# Patient Record
Sex: Male | Born: 1980 | Hispanic: No | Marital: Single | Smoking: Never smoker
Health system: Southern US, Community
[De-identification: ages and names within clinical notes are randomized; demographics above are authoritative.]

## PROBLEM LIST (undated history)

## (undated) DIAGNOSIS — F909 Attention-deficit hyperactivity disorder, unspecified type: Secondary | ICD-10-CM

## (undated) DIAGNOSIS — F319 Bipolar disorder, unspecified: Secondary | ICD-10-CM

## (undated) DIAGNOSIS — F32A Depression, unspecified: Secondary | ICD-10-CM

## (undated) DIAGNOSIS — F329 Major depressive disorder, single episode, unspecified: Secondary | ICD-10-CM

## (undated) HISTORY — DX: Depression, unspecified: F32.A

## (undated) HISTORY — DX: Major depressive disorder, single episode, unspecified: F32.9

---

## 2014-12-17 ENCOUNTER — Emergency Department (HOSPITAL_COMMUNITY): Payer: 59

## 2014-12-17 ENCOUNTER — Other Ambulatory Visit: Payer: Self-pay

## 2014-12-17 ENCOUNTER — Emergency Department (HOSPITAL_COMMUNITY)
Admission: EM | Admit: 2014-12-17 | Discharge: 2014-12-17 | Disposition: A | Discharge status: Home / Self Care | Payer: 59 | Attending: Emergency Medicine | Admitting: Emergency Medicine

## 2014-12-17 ENCOUNTER — Encounter (HOSPITAL_COMMUNITY): Payer: Self-pay | Admitting: Family Medicine

## 2014-12-17 DIAGNOSIS — R0602 Shortness of breath: Secondary | ICD-10-CM | POA: Diagnosis not present

## 2014-12-17 DIAGNOSIS — H538 Other visual disturbances: Secondary | ICD-10-CM | POA: Insufficient documentation

## 2014-12-17 DIAGNOSIS — F319 Bipolar disorder, unspecified: Secondary | ICD-10-CM | POA: Insufficient documentation

## 2014-12-17 DIAGNOSIS — F419 Anxiety disorder, unspecified: Secondary | ICD-10-CM | POA: Insufficient documentation

## 2014-12-17 DIAGNOSIS — R51 Headache: Secondary | ICD-10-CM | POA: Insufficient documentation

## 2014-12-17 DIAGNOSIS — H539 Unspecified visual disturbance: Secondary | ICD-10-CM

## 2014-12-17 DIAGNOSIS — R000 Tachycardia, unspecified: Secondary | ICD-10-CM | POA: Diagnosis not present

## 2014-12-17 HISTORY — DX: Bipolar disorder, unspecified: F31.9

## 2014-12-17 HISTORY — DX: Attention-deficit hyperactivity disorder, unspecified type: F90.9

## 2014-12-17 LAB — BASIC METABOLIC PANEL
ANION GAP: 7 (ref 5–15)
BUN: 9 mg/dL (ref 6–20)
CALCIUM: 9.4 mg/dL (ref 8.9–10.3)
CO2: 26 mmol/L (ref 22–32)
Chloride: 106 mmol/L (ref 101–111)
Creatinine, Ser: 1.19 mg/dL (ref 0.61–1.24)
GFR calc Af Amer: >60 mL/min (ref >60)
Glucose, Bld: 94 mg/dL (ref 65–99)
POTASSIUM: 4.1 mmol/L (ref 3.5–5.1)
Sodium: 139 mmol/L (ref 135–145)

## 2014-12-17 LAB — CBC
HCT: 42.6 % (ref 39.0–52.0)
Hemoglobin: 14.2 g/dL (ref 13.0–17.0)
MCH: 29.8 pg (ref 26.0–34.0)
MCHC: 33.3 g/dL (ref 30.0–36.0)
MCV: 89.3 fL (ref 78.0–100.0)
PLATELETS: 156 10*3/uL (ref 150–400)
RBC: 4.77 MIL/uL (ref 4.22–5.81)
RDW: 12.8 % (ref 11.5–15.5)
WBC: 8 10*3/uL (ref 4.0–10.5)

## 2014-12-17 LAB — LITHIUM LEVEL: Lithium Lvl: 0.41 mmol/L — ABNORMAL LOW (ref 0.60–1.20)

## 2014-12-17 LAB — I-STAT TROPONIN, ED: TROPONIN I, POC: 0 ng/mL (ref 0.00–0.08)

## 2014-12-17 MED ORDER — GADOBENATE DIMEGLUMINE 529 MG/ML IV SOLN
19.0000 mL | Freq: Once | INTRAVENOUS | Status: AC | PRN
  Administered 2014-12-17: 19 mL via INTRAVENOUS

## 2014-12-17 MED ORDER — LITHIUM CARBONATE 300 MG PO CAPS
450.0000 mg | ORAL_CAPSULE | Freq: Two times a day (BID) | ORAL | Status: DC
  Administered 2014-12-17: 450 mg via ORAL
  Filled 2014-12-17 (×2): qty 1

## 2014-12-17 NOTE — ED Notes (Signed)
Pt here for blurred vision, confusion, SOB, tachy and headaches over the past week. Sent here by doctor.

## 2014-12-17 NOTE — ED Provider Notes (Signed)
Pt's care turned over to me with ct pending and need for discussion with Neurology.  Pt has had visual loss on and off for the past 2 weeks.  On my evaluation pt still has visual difficulty.  Pt reports decreased vision with both eyes.  Ct returned and is normal  I discussed with Dr. Cyril Mourningamillo who advised MRi with and without.  Pt request dosage of lithium.   Pt has stable vitals.   MRi with and without is negative.   I discussed with Dr. Roseanne RenoStewart who advised to have pt follow up with Opthomology for further vision evaluation.   Lonia SkinnerLeslie K WaldorfSofia, PA-C 12/17/14 2146  Pricilla LovelessScott Goldston, MD 12/19/14 [phone]

## 2014-12-17 NOTE — Discharge Instructions (Signed)
Visual Disturbances °You have had a disturbance in your vision. This may be caused by various conditions, such as: °· Migraines. Migraine headaches are often preceded by a disturbance in vision. Blind spots or light flashes are followed by a headache. This type of visual disturbance is temporary. It does not damage the eye. °· Glaucoma. This is caused by increased pressure in the eye. Symptoms include haziness, blurred vision, or seeing rainbow colored circles when looking at bright lights. Partial or complete visual loss can occur. You may or may not experience eye pain. Visual loss may be gradual or sudden and is irreversible. Glaucoma is the leading cause of blindness. °· Retina problems. Vision will be reduced if the retina becomes detached or if there is a circulation problem as with diabetes, high blood pressure, or a mini-stroke. Symptoms include seeing "floaters," flashes of light, or shadows, as if a curtain has fallen over your eye. °· Optic nerve problems. The main nerve in your eye can be damaged by redness, soreness, and swelling (inflammation), poor circulation, drugs, and toxins. °It is very important to have a complete exam done by a specialist to determine the exact cause of your eye problem. The specialist may recommend medicines or surgery, depending on the cause of the problem. This can help prevent further loss of vision or reduce the risk of having a stroke. Contact the caregiver to whom you have been referred and arrange for follow-up care right away. °SEEK IMMEDIATE MEDICAL CARE IF:  °· Your vision gets worse. °· You develop severe headaches. °· You have any weakness or numbness in the face, arms, or legs. °· You have any trouble speaking or walking. °  °This information is not intended to replace advice given to you by your health care provider. Make sure you discuss any questions you have with your health care provider. °  °Document Released: 03/05/2004 Document Revised: 04/20/2011 Document  Reviewed: 07/05/2013 °Elsevier Interactive Patient Education ©2016 [employer] ° °

## 2014-12-17 NOTE — ED Provider Notes (Signed)
CSN: [id number]     Arrival date & time 12/17/14  1131 History   First MD Initiated Contact with Patient 12/17/14 1356     Chief Complaint  Patient presents with  . Blurred Vision  . Headache  . Shortness of Breath  . Tachycardia     (Consider location/radiation/quality/duration/timing/severity/associated sxs/prior Treatment) HPI   Michael Higgins is a(n) 34 y.o. male who presents to the ED with cc of intermittent blurry vision, hot flashes, intermittent mild headaches, and short term memory deficits. Onset 1 week ago. Patient states that at times he has had difficulty driving, seeing road signs, or his computer screen. It is affecting his work as her travels to different work sites. The patient is on lithium was afraid he may have a lithium toxicity. He denies gait abnormality, myoclonic jerking. His partner who is with him states that he does seem to have some short-term memory deficits as of recent. The patient has mild global headaches that do not seem to correlate with his visual disturbances. He is not taking any anti-cholinergics. He has no difficulty with urination. No dry mouth, or sweating. Patient states that he has been on his current medication regimen for some time and has had no recent changes.  He has a past medical history of HIV and is currently taking medication.  Past Medical History  Diagnosis Date  . Bipolar 1 disorder (HCC)   . ADHD (attention deficit hyperactivity disorder)    History reviewed. No pertinent past surgical history. History reviewed. No pertinent family history. Social History  Substance Use Topics  . Smoking status: Never Smoker   . Smokeless tobacco: None  . Alcohol Use: Yes    Review of Systems  Ten systems reviewed and are negative for acute change, except as noted in the HPI.   Allergies  Wellbutrin  Home Medications   Prior to Admission medications   Medication Sig Start Date End Date Taking? Authorizing Provider   Abacavir-Dolutegravir-Lamivud (TRIUMEQ) 600-50-300 MG TABS Take 1 tablet by mouth daily.   Yes Historical Provider, MD  amphetamine-dextroamphetamine (ADDERALL XR) 30 MG 24 hr capsule Take 30 mg by mouth 2 (two) times daily.   Yes Historical Provider, MD  ibuprofen (ADVIL,MOTRIN) 200 MG tablet Take 200 mg by mouth every 6 (six) hours as needed for fever.   Yes Historical Provider, MD  lithium carbonate 150 MG capsule Take 450 mg by mouth 2 (two) times daily with a meal.   Yes Historical Provider, MD  LORazepam (ATIVAN) 1 MG tablet Take 1 mg by mouth 2 (two) times daily as needed for anxiety.   Yes Historical Provider, MD  omeprazole (PRILOSEC) 20 MG capsule Take 20 mg by mouth daily as needed (heartburn).   Yes Historical Provider, MD   BP 138/86 mmHg  Pulse 89  Resp 16  SpO2 98% Physical Exam  Constitutional: He is oriented to person, place, and time. He appears well-developed and well-nourished.  HENT:  Head: Normocephalic and atraumatic.  Eyes: Conjunctivae are normal.  Neck: Normal range of motion.  Cardiovascular: Normal rate and regular rhythm.   Pulmonary/Chest: Effort normal.  Abdominal: Soft. He exhibits no distension.  Musculoskeletal: He exhibits no edema.  Neurological: He is oriented to person, place, and time.  Skin: Skin is warm. No rash noted.  Psychiatric:  Nervous/anxious  Nursing note and vitals reviewed.   ED Course  Procedures (including critical care time) Labs Review Labs Reviewed  LITHIUM LEVEL - Abnormal; Notable for the following:  Lithium Lvl 0.41 (*)    All other components within normal limits  BASIC METABOLIC PANEL  CBC  I-STAT TROPOININ, ED    Imaging Review No results found. I have personally reviewed and evaluated these images and lab results as part of my medical decision-making.   EKG Interpretation None      MDM   Final diagnoses:  Visual disturbances    Date admitted reviewed with our pharmacist. Patient is taking  Adderall extended release 30 mg twice daily. This is the only medication, which could cause the current symptoms that he is complaining about, including blurry vision, dizziness, anxiety, headaches. The rest of his medications do not appear to have any of the side effects. His lithium level is low. I have consulted with Dr. Clarene DukeLittle. The patient will get a CT head and   Current Near vision OD:20/200 OS:20/200 BL:20/200  Patient care handoff to PA Sophia. Plan CT. If normal, consult with neuro.  Arthor Captainbigail Takerra Lupinacci, PA-C 12/18/14 1754  Laurence Spatesachel Morgan Little, MD 12/19/14 [phone]

## 2014-12-17 NOTE — ED Notes (Signed)
Pt reports SOB on arrival. He states this is all medication related, not "pulmonary" and refuses chest xray and all tests to be done after seeing a doctor.

## 2014-12-17 NOTE — ED Notes (Signed)
Patient c/o blurred vision x 2 weeks, states he thinks it may be assoc. With his daily medications. States the blurred vision is off and on. C/o seasonal alleries.

## 2014-12-17 NOTE — ED Notes (Signed)
PA at bedside.

## 2015-01-09 ENCOUNTER — Emergency Department (HOSPITAL_COMMUNITY)
Admission: EM | Admit: 2015-01-09 | Discharge: 2015-01-10 | Disposition: A | Discharge status: Other Acute Inpatient Hospital | Payer: 59 | Attending: Emergency Medicine | Admitting: Emergency Medicine

## 2015-01-09 ENCOUNTER — Encounter (HOSPITAL_COMMUNITY): Payer: Self-pay | Admitting: Emergency Medicine

## 2015-01-09 DIAGNOSIS — Z21 Asymptomatic human immunodeficiency virus [HIV] infection status: Secondary | ICD-10-CM | POA: Diagnosis not present

## 2015-01-09 DIAGNOSIS — Z79899 Other long term (current) drug therapy: Secondary | ICD-10-CM | POA: Insufficient documentation

## 2015-01-09 DIAGNOSIS — R45851 Suicidal ideations: Secondary | ICD-10-CM | POA: Diagnosis present

## 2015-01-09 DIAGNOSIS — F909 Attention-deficit hyperactivity disorder, unspecified type: Secondary | ICD-10-CM | POA: Diagnosis not present

## 2015-01-09 DIAGNOSIS — F1510 Other stimulant abuse, uncomplicated: Secondary | ICD-10-CM | POA: Insufficient documentation

## 2015-01-09 DIAGNOSIS — F329 Major depressive disorder, single episode, unspecified: Secondary | ICD-10-CM

## 2015-01-09 DIAGNOSIS — Z915 Personal history of self-harm: Secondary | ICD-10-CM | POA: Insufficient documentation

## 2015-01-09 DIAGNOSIS — F419 Anxiety disorder, unspecified: Secondary | ICD-10-CM | POA: Insufficient documentation

## 2015-01-09 DIAGNOSIS — F1210 Cannabis abuse, uncomplicated: Secondary | ICD-10-CM | POA: Diagnosis not present

## 2015-01-09 DIAGNOSIS — F319 Bipolar disorder, unspecified: Secondary | ICD-10-CM | POA: Insufficient documentation

## 2015-01-09 DIAGNOSIS — F32A Depression, unspecified: Secondary | ICD-10-CM

## 2015-01-09 LAB — COMPREHENSIVE METABOLIC PANEL
ALT: 34 U/L (ref 17–63)
AST: 20 U/L (ref 15–41)
Albumin: 4.2 g/dL (ref 3.5–5.0)
Alkaline Phosphatase: 41 U/L (ref 38–126)
Anion gap: 6 (ref 5–15)
BILIRUBIN TOTAL: 0.8 mg/dL (ref 0.3–1.2)
BUN: 9 mg/dL (ref 6–20)
CO2: 28 mmol/L (ref 22–32)
CREATININE: 1.11 mg/dL (ref 0.61–1.24)
Calcium: 9.6 mg/dL (ref 8.9–10.3)
Chloride: 104 mmol/L (ref 101–111)
Glucose, Bld: 131 mg/dL — ABNORMAL HIGH (ref 65–99)
Potassium: 3.6 mmol/L (ref 3.5–5.1)
Sodium: 138 mmol/L (ref 135–145)
TOTAL PROTEIN: 7.1 g/dL (ref 6.5–8.1)

## 2015-01-09 LAB — RAPID URINE DRUG SCREEN, HOSP PERFORMED
Amphetamines: POSITIVE — AB
Barbiturates: NOT DETECTED
Benzodiazepines: NOT DETECTED
Cocaine: NOT DETECTED
Opiates: NOT DETECTED
Tetrahydrocannabinol: POSITIVE — AB

## 2015-01-09 LAB — LITHIUM LEVEL: LITHIUM LVL: 0.22 mmol/L — AB (ref 0.60–1.20)

## 2015-01-09 LAB — ETHANOL

## 2015-01-09 LAB — CBC
HCT: 44.1 % (ref 39.0–52.0)
Hemoglobin: 15.3 g/dL (ref 13.0–17.0)
MCH: 31.3 pg (ref 26.0–34.0)
MCHC: 34.7 g/dL (ref 30.0–36.0)
MCV: 90.2 fL (ref 78.0–100.0)
PLATELETS: 167 10*3/uL (ref 150–400)
RBC: 4.89 MIL/uL (ref 4.22–5.81)
RDW: 13.1 % (ref 11.5–15.5)
WBC: 9.4 10*3/uL (ref 4.0–10.5)

## 2015-01-09 LAB — ACETAMINOPHEN LEVEL: Acetaminophen (Tylenol), Serum: <10 ug/mL — ABNORMAL LOW (ref 10–30)

## 2015-01-09 LAB — SALICYLATE LEVEL: Salicylate Lvl: <4 mg/dL (ref 2.8–30.0)

## 2015-01-09 MED ORDER — LISDEXAMFETAMINE DIMESYLATE 20 MG PO CAPS: 40.0000 mg | ORAL_CAPSULE | ORAL | Status: DC

## 2015-01-09 MED ORDER — LORAZEPAM 1 MG PO TABS: 1.0000 mg | ORAL_TABLET | Freq: Two times a day (BID) | ORAL | Status: DC | PRN

## 2015-01-09 MED ORDER — LITHIUM CARBONATE 300 MG PO CAPS: 450.0000 mg | ORAL_CAPSULE | ORAL | Status: DC

## 2015-01-09 MED ORDER — ABACAVIR-DOLUTEGRAVIR-LAMIVUD 600-50-300 MG PO TABS: 1.0000 | ORAL_TABLET | Freq: Every day | ORAL | Status: DC

## 2015-01-09 NOTE — ED Notes (Signed)
Pt belongings given to friend Percell LocusJeremiah Johnson to take home.

## 2015-01-09 NOTE — ED Notes (Signed)
Patient reports change in lithium recently.

## 2015-01-09 NOTE — ED Notes (Signed)
Sitter present at the bedside 
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
## 2015-01-09 NOTE — ED Notes (Signed)
Gail, NP, at the bedside.  

## 2015-01-09 NOTE — ED Notes (Signed)
Per pt, states "I just cant deal with another day, im nearly suicidal." pt on lithium, patients PCP is weaning patient off medication and pt has having suicidal ideation, no plan. Pt feels very anxious.

## 2015-01-10 ENCOUNTER — Inpatient Hospital Stay (HOSPITAL_COMMUNITY)
Admission: AD | Admit: 2015-01-10 | Discharge: 2015-01-14 | DRG: 885 | Disposition: A | Discharge status: Home / Self Care | Payer: 59 | Source: Intra-hospital | Attending: Psychiatry | Admitting: Psychiatry

## 2015-01-10 ENCOUNTER — Encounter (HOSPITAL_COMMUNITY): Payer: Self-pay

## 2015-01-10 DIAGNOSIS — R45851 Suicidal ideations: Secondary | ICD-10-CM | POA: Diagnosis present

## 2015-01-10 DIAGNOSIS — G4700 Insomnia, unspecified: Secondary | ICD-10-CM | POA: Diagnosis present

## 2015-01-10 DIAGNOSIS — F314 Bipolar disorder, current episode depressed, severe, without psychotic features: Secondary | ICD-10-CM | POA: Diagnosis present

## 2015-01-10 DIAGNOSIS — F3130 Bipolar disorder, current episode depressed, mild or moderate severity, unspecified: Secondary | ICD-10-CM | POA: Diagnosis not present

## 2015-01-10 DIAGNOSIS — F332 Major depressive disorder, recurrent severe without psychotic features: Secondary | ICD-10-CM

## 2015-01-10 DIAGNOSIS — F319 Bipolar disorder, unspecified: Secondary | ICD-10-CM | POA: Diagnosis present

## 2015-01-10 MED ORDER — LAMIVUDINE 150 MG PO TABS
300.0000 mg | ORAL_TABLET | Freq: Every day | ORAL | Status: DC
  Administered 2015-01-10: 300 mg via ORAL
  Filled 2015-01-10: qty 2

## 2015-01-10 MED ORDER — LITHIUM CARBONATE 300 MG PO CAPS
450.0000 mg | ORAL_CAPSULE | Freq: Every day | ORAL | Status: DC
  Filled 2015-01-10: qty 1

## 2015-01-10 MED ORDER — ALUM & MAG HYDROXIDE-SIMETH 200-200-20 MG/5ML PO SUSP: 30.0000 mL | ORAL | Status: DC | PRN

## 2015-01-10 MED ORDER — ABACAVIR-DOLUTEGRAVIR-LAMIVUD 600-50-300 MG PO TABS: 1.0000 | ORAL_TABLET | Freq: Every day | ORAL | Status: DC

## 2015-01-10 MED ORDER — MAGNESIUM HYDROXIDE 400 MG/5ML PO SUSP: 30.0000 mL | Freq: Every day | ORAL | Status: DC | PRN

## 2015-01-10 MED ORDER — DOLUTEGRAVIR SODIUM 50 MG PO TABS
50.0000 mg | ORAL_TABLET | Freq: Every day | ORAL | Status: DC
  Filled 2015-01-10: qty 1

## 2015-01-10 MED ORDER — DOLUTEGRAVIR SODIUM 50 MG PO TABS
50.0000 mg | ORAL_TABLET | Freq: Every day | ORAL | Status: DC
  Administered 2015-01-10: 50 mg via ORAL

## 2015-01-10 MED ORDER — ABACAVIR SULFATE 300 MG PO TABS
600.0000 mg | ORAL_TABLET | Freq: Every day | ORAL | Status: DC
  Filled 2015-01-10 (×2): qty 2

## 2015-01-10 MED ORDER — DOLUTEGRAVIR SODIUM 50 MG PO TABS
50.0000 mg | ORAL_TABLET | Freq: Every day | ORAL | Status: DC
  Filled 2015-01-10 (×3): qty 1

## 2015-01-10 MED ORDER — LAMIVUDINE 150 MG PO TABS
300.0000 mg | ORAL_TABLET | Freq: Every day | ORAL | Status: DC
  Administered 2015-01-10 – 2015-01-13 (×4): 300 mg via ORAL
  Filled 2015-01-10 (×5): qty 2

## 2015-01-10 MED ORDER — LAMIVUDINE 150 MG PO TABS
300.0000 mg | ORAL_TABLET | Freq: Every day | ORAL | Status: DC
  Filled 2015-01-10: qty 2

## 2015-01-10 MED ORDER — DOLUTEGRAVIR SODIUM 50 MG PO TABS
50.0000 mg | ORAL_TABLET | Freq: Every day | ORAL | Status: DC
  Administered 2015-01-10 – 2015-01-13 (×4): 50 mg via ORAL
  Filled 2015-01-10 (×5): qty 1

## 2015-01-10 MED ORDER — LAMIVUDINE 150 MG PO TABS
300.0000 mg | ORAL_TABLET | Freq: Every day | ORAL | Status: DC
  Filled 2015-01-10 (×3): qty 2

## 2015-01-10 MED ORDER — ABACAVIR SULFATE 300 MG PO TABS
600.0000 mg | ORAL_TABLET | Freq: Every day | ORAL | Status: DC
  Filled 2015-01-10 (×3): qty 2

## 2015-01-10 MED ORDER — CITALOPRAM HYDROBROMIDE 20 MG PO TABS
20.0000 mg | ORAL_TABLET | Freq: Every day | ORAL | Status: DC
  Administered 2015-01-11 – 2015-01-14 (×4): 20 mg via ORAL
  Filled 2015-01-10 (×6): qty 1

## 2015-01-10 MED ORDER — NICOTINE 21 MG/24HR TD PT24
21.0000 mg | MEDICATED_PATCH | Freq: Every day | TRANSDERMAL | Status: DC
  Filled 2015-01-10 (×3): qty 1

## 2015-01-10 MED ORDER — DOLUTEGRAVIR SODIUM 50 MG PO TABS
50.0000 mg | ORAL_TABLET | Freq: Every day | ORAL | Status: DC
  Filled 2015-01-10 (×2): qty 1

## 2015-01-10 MED ORDER — TRAZODONE HCL 50 MG PO TABS
50.0000 mg | ORAL_TABLET | Freq: Every evening | ORAL | Status: DC | PRN
Start: 2015-01-10 — End: 2015-01-12
  Administered 2015-01-10 – 2015-01-11 (×2): 50 mg via ORAL
  Filled 2015-01-10 (×2): qty 1

## 2015-01-10 MED ORDER — ABACAVIR SULFATE 300 MG PO TABS
600.0000 mg | ORAL_TABLET | Freq: Every day | ORAL | Status: DC
  Administered 2015-01-10 – 2015-01-13 (×4): 600 mg via ORAL
  Filled 2015-01-10 (×5): qty 2

## 2015-01-10 MED ORDER — ACETAMINOPHEN 325 MG PO TABS
650.0000 mg | ORAL_TABLET | Freq: Once | ORAL | Status: AC
  Administered 2015-01-10: 650 mg via ORAL
  Filled 2015-01-10: qty 2

## 2015-01-10 MED ORDER — LITHIUM CARBONATE 300 MG PO CAPS: 300.0000 mg | ORAL_CAPSULE | Freq: Every day | ORAL | Status: DC

## 2015-01-10 MED ORDER — PANTOPRAZOLE SODIUM 40 MG PO TBEC
40.0000 mg | DELAYED_RELEASE_TABLET | Freq: Every day | ORAL | Status: DC
  Administered 2015-01-11 – 2015-01-14 (×4): 40 mg via ORAL
  Filled 2015-01-10 (×6): qty 1

## 2015-01-10 MED ORDER — ABACAVIR SULFATE-LAMIVUDINE 600-300 MG PO TABS
1.0000 | ORAL_TABLET | Freq: Every day | ORAL | Status: DC
  Filled 2015-01-10 (×2): qty 1

## 2015-01-10 MED ORDER — ACETAMINOPHEN 325 MG PO TABS
650.0000 mg | ORAL_TABLET | Freq: Four times a day (QID) | ORAL | Status: DC | PRN
  Administered 2015-01-10 – 2015-01-13 (×6): 650 mg via ORAL
  Filled 2015-01-10 (×6): qty 2

## 2015-01-10 MED ORDER — ABACAVIR SULFATE-LAMIVUDINE 600-300 MG PO TABS
1.0000 | ORAL_TABLET | Freq: Every day | ORAL | Status: DC
  Filled 2015-01-10: qty 1

## 2015-01-10 MED ORDER — ACETAMINOPHEN 325 MG PO TABS
650.0000 mg | ORAL_TABLET | Freq: Four times a day (QID) | ORAL | Status: DC | PRN
  Administered 2015-01-10: 650 mg via ORAL
  Filled 2015-01-10: qty 2

## 2015-01-10 MED ORDER — ABACAVIR SULFATE 300 MG PO TABS
600.0000 mg | ORAL_TABLET | Freq: Every day | ORAL | Status: DC
  Administered 2015-01-10: 600 mg via ORAL
  Filled 2015-01-10: qty 2

## 2015-01-10 NOTE — ED Notes (Signed)
Pt signed voluntary admission and consent for tx form - 1 copy sent w/ pt, 1 copy placed in medical records
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
## 2015-01-10 NOTE — ED Notes (Signed)
[facility] will be sent.

## 2015-01-10 NOTE — Tx Team (Signed)
Initial Interdisciplinary Treatment Plan   PATIENT STRESSORS: Health problems Marital or family conflict Medication change or noncompliance Substance abuse   PATIENT STRENGTHS: Ability for insight Active sense of humor Average or above average intelligence Capable of independent living General fund of knowledge Supportive family/friends Work skills   PROBLEM LIST: Problem List/Patient Goals Date to be addressed Date deferred Reason deferred Estimated date of resolution  "I want my medications right." 01/10/2015     "I have been having suicidal thoughts off and on." 01/10/2015                                                DISCHARGE CRITERIA:  Adequate post-discharge living arrangements Improved stabilization in mood, thinking, and/or behavior Medical problems require only outpatient monitoring  PRELIMINARY DISCHARGE PLAN: Outpatient therapy Participate in family therapy Return to previous living arrangement  PATIENT/FAMIILY INVOLVEMENT: This treatment plan has been presented to and reviewed with the patient, Michael Higgins.  The patient and family have been given the opportunity to ask questions and make suggestions.  Michael Higgins 01/10/2015, 5:37 AM

## 2015-01-10 NOTE — ED Notes (Signed)
TTS AT BEDSIDE 
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
## 2015-01-10 NOTE — [facility] Group Notes (Signed)
[facility] Group Notes:  (Nursing/MHT/Case Management/Adjunct)  Date:  01/10/2015  Time:  3:58 PM  Type of Therapy:  Psychoeducational Skills  Participation Level:  Did Not Attend  Participation Quality:  DID NOT ATTEND  Affect:  DID NOT ATTEND  Cognitive:  DID NOT ATTEND  Insight:  None  Engagement in Group:  DID NOT ATTEND  Modes of Intervention:  DID NOT ATTEND  Summary of Progress/Problems: Pt did not attend patient self inventory group.   Bethann PunchesJane O Kahlel Peake 01/10/2015, 3:58 PM

## 2015-01-10 NOTE — [facility] Suicide Risk Assessment (Signed)
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 Bipolar affective disorder ([facility]) [F31.9] 01/10/2015     Continued Clinical Symptoms:  Alcohol Use Disorder Identification Test Final Score (AUDIT): 2 The "Alcohol Use Disorders Identification Test", Guidelines for Use in Primary Care, Second Edition.  World Science writer [facility]). Score between 0-7:  no or low risk or alcohol related problems. Score between 8-15:  moderate risk of alcohol related problems. Score between 16-19:  high risk of alcohol related problems. Score 20 or above:  warrants further diagnostic evaluation for alcohol dependence and treatment.   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  He presented to ED voluntarily, describing worsening depression, anxiety, suicidal ideations. Presents with symptoms of depression, and describes sense of sadness, depression, anhedonia, and low energy level .  States he has struggled with depression for several years , particularly after having to leave [employer] in the middle of his training . Prior to this had no psychiatric symptoms or history.  Had initially been started on Wellbutrin but felt worse and more agitated on this medication, and was later switched to Lithium/Seroquel, due to concerns of Bipolarity . Has also been told he may have ADHD in the past . Of note, does not endorse any clear history of mania or hypomania- cannot identify any discrete period of time  where he had increased energy, racing thoughts, reckless or impulsive behaviors, decreased need for sleep, irritability, or reckless spending . Does describe depression and as noted, at this time endorses neuro-vegetative symptoms of depression. Stopped Seroquel /Lithium earlier this week, and lithium serum level 0.22 Denies alcohol abuse, endorses cannabis abuse . At this time denies any suicidal plan or intention on unit. No psychotic symptoms or history of psychosis. At patient's request , also spoke via phone with his SO, who provided collateral information, mainly corroborating above history. Dx- Major Depression, Recurrent , No psychotic features  Plan- inpatient admission- Agrees to CELEXA trial- start 20 mgrs QDAY . Side effects and rationale discussed . Would not restart Lithium as no clear history of bipolarity and patient stating he felt it was making him worse .      Musculoskeletal: Strength & Muscle Tone: within normal limits Gait & Station: normal Patient leans: N/A  Psychiatric Specialty Exam: Physical Exam  ROS no tremors, no diaphoresis, no chest pain, no vomiting  Blood pressure 129/66, pulse 89, temperature 98.5 F (36.9 C), temperature source Oral, resp. rate 18, height  (1.803 m), weight 196 lb (88.905 kg), SpO2 99 %.Body mass index is 27.35 kg/(m^2).  General Appearance: Fairly Groomed  Patent attorney::  Good  Speech:  Normal Rate  Volume:  Normal  Mood:  Depressed  Affect:  Constricted  Thought Process:  Linear  Orientation:  Full (Time, Place, and Person)  Thought Content:  ruminative about medication issues, medications not helping him feel better, denies hallucinations, no delusions   Suicidal Thoughts:  No today denies suicidal ideations and contracts for safety on unit  Homicidal Thoughts:  No  Memory:  recent and remote grossly intact   Judgement:  Fair  Insight:  Fair  Psychomotor Activity:  Normal  Concentration:  Good  Recall:  Good  Fund of  Knowledge:Good  Language: Good  Akathisia:  Negative  Handed:  Right  AIMS (if indicated):     Assets:  Communication Skills Desire for Improvement Resilience Social Support  Sleep:     Cognition: WNL  ADL's:  Intact     COGNITIVE FEATURES THAT CONTRIBUTE TO RISK:  Closed-mindedness and Loss of executive function    SUICIDE RISK:   Moderate:  Frequent suicidal ideation with limited intensity, and duration, some specificity in terms of plans, no associated intent, good self-control, limited dysphoria/symptomatology, some risk factors present, and identifiable protective factors, including available and accessible social support.  PLAN OF CARE: Patient will be admitted to inpatient psychiatric unit for stabilization and safety. Will provide and encourage milieu participation. Provide medication management and maked adjustments as needed.  Will follow daily.    Medical Decision Making:  Review of Psycho-Social Stressors (1), Review or order clinical lab tests (1), Established Problem, Worsening (2) and Review of New Medication or Change in Dosage (2)  I certify that inpatient services furnished can reasonably be expected to improve the patient's condition.   COBOS, Madaline GuthrieFERNANDO 01/10/2015, 4:33 PM

## 2015-01-10 NOTE — [facility] Group Notes (Addendum)
[facility] 01/10/2015 1:15pm  Type of Therapy: Mental Health Association Presentation  Pt did not attend, declined invitation.    Chad CordialLauren Carter, LCSWA 01/10/2015 1:32 PM

## 2015-01-10 NOTE — ED Notes (Signed)
Pt states he takes triumeq at bedtime at home, RN called pharmacy and was told we do not carry triumeq however, to give pt epzicom and tivicay together for same interraction as triumeq would. Pt informed of delay and informed pharmacy would be sending meds up. Pt verbalized understanding.

## 2015-01-10 NOTE — ED Notes (Signed)
[facility] arrived to transport pt

## 2015-01-10 NOTE — Progress Notes (Signed)
D. Pt has been in his room most of the shift, has been isolating himself and not interacting with others. Pt refused to take his medication stating that he has never been on those medications and wanted to talk to the doctor. Pt's partner called earlier and wanted to talk to dr too. As per self inventory, pt had poor sleep, fair appetite, low energy and poor concentration. He rates his depression at a 9, hopelessness at a 10, anxiety at a 8. Pt's safety ensured with 15 minute and environmental checks. Pt currently denies SI/HI and A/V hallucinations. Pt verbally agrees to seek staff if SI/HI or A/VH occurs and to consult with staff before acting on these thoughts. Will continue POC.

## 2015-01-10 NOTE — Tx Team (Signed)
Interdisciplinary Treatment Plan Update (Adult) Date: 01/10/2015   Date: 01/10/2015 8:53 AM  Progress in Treatment:  Attending groups: Pt is new to milieu, continuing to assess  Participating in groups: Pt is new to milieu, continuing to assess  Taking medication as prescribed: Yes  Tolerating medication: Yes  Family/Significant othe contact made: yes, with partner Patient understands diagnosis: Continuing to assess Discussing patient identified problems/goals with staff: Yes  Medical problems stabilized or resolved: Yes  Denies suicidal/homicidal ideation: Yes Patient has not harmed self or Others: Yes   New problem(s) identified: None identified at this time.   Discharge Plan or Barriers: Pt will return home and follow up with [facility] [place]  Additional comments:  Patient and CSW reviewed pt's identified goals and treatment plan. Patient verbalized understanding and agreed to treatment plan. CSW reviewed [facility] "Discharge Process and Patient Involvement" Form. Pt verbalized understanding of information provided and signed form.   Reason for Continuation of Hospitalization:  Depression Medication stabilization Suicidal ideation  Estimated length of stay: 3-5 days  Review of initial/current patient goals per problem list:   1.  Goal(s): Patient will participate in aftercare plan  Met:  Yes  Target date: 3-5 days from date of admission   As evidenced by: Patient will participate within aftercare plan AEB aftercare provider and housing plan at discharge being identified.  01/10/15: Pt will discharge home and follow-up with outpatient resources  2.  Goal (s): Patient will exhibit decreased depressive symptoms and suicidal ideations.  Met:  No  Target date: 3-5 days from date of admission   As evidenced by: Patient will utilize self rating of depression at 3 or below and demonstrate decreased signs of depression or be deemed stable for discharge by  MD. 01/10/15: Pt was admitted with symptoms of depression, rating 10/10. Pt continues to present with flat affect and depressive symptoms.  Pt will demonstrate decreased symptoms of depression and rate depression at 3/10 or lower prior to discharge.  Attendees:  Patient:    Family:    Physician: Dr. Parke Poisson, MD  01/10/2015 8:53 AM  Nursing: Lars Pinks, RN Case manager  01/10/2015 8:53 AM  Clinical Social Worker Peri Maris, [place] 01/10/2015 8:53 AM  Other: Tilden Fossa, [place] 01/10/2015 8:53 AM  Clinical:  Eulogio Bear, RN 01/10/2015 8:53 AM  Other: , RN Charge Nurse 01/10/2015 8:53 AM  Other: Hilda Lias, [place], [place] Social Work [phone]

## 2015-01-10 NOTE — [facility] Suicide Risk Assessment (Signed)
[facility] INPATIENT:  Family/Significant Other Suicide Prevention Education  Suicide Prevention Education:  Education Completed; Michael LocusJeremiah Higgins, Pt's partner [phone],  (name of family member/significant other) has been identified by the patient as the family member/significant other with whom the patient will be residing, and identified as the person(s) who will aid the patient in the event of a mental health crisis (suicidal ideations/suicide attempt).  With written consent from the patient, the family member/significant other has been provided the following suicide prevention education, prior to the and/or following the discharge of the patient.  The suicide prevention education provided includes the following:  Suicide risk factors  Suicide prevention and interventions  National Suicide Hotline telephone number  [facility] assessment telephone number  [facility] Emergency Assistance 911  [facility] and/or Residential Mobile Crisis Unit telephone number  Request made of family/significant other to:  Remove weapons (e.g., guns, rifles, knives), all items previously/currently identified as safety concern.    Remove drugs/medications (over-the-counter, prescriptions, illicit drugs), all items previously/currently identified as a safety concern.  The family member/significant other verbalizes understanding of the suicide prevention education information provided.  The family member/significant other agrees to remove the items of safety concern listed above.  Michael Higgins, Michael Higgins M 01/10/2015, 12:36 PM
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
## 2015-01-10 NOTE — ED Notes (Signed)
Pt ambulatory from POD E with ED NT as sitter. Pt partner given pod C rules and given opportunity to tell pt goodbye before leaving. Pt given warm blankets. nad noted, calm and cooperative.

## 2015-01-10 NOTE — Progress Notes (Signed)
Patient did not attend karaoke group tonight. 

## 2015-01-10 NOTE — [facility] Counselor (Signed)
Per Donell SievertSpencer Simon, PA, pt meets inpatient criteria. Per Rosey BathKelly, AC, [facility] under the care of Dr Jama Flavorsobos. [facility] will call when pt is able to come over.  - Cyndie MullAnna Alyus Mofield, [facility]   Triage Specialist

## 2015-01-10 NOTE — B[facility] Counselor (Addendum)
Per Rosey BathKelly [place], AC, [facility] at [facility]. Counselor informed Duwayne Heckanielle, Charity fundraiserN at [facility]. Counselor could not reach attending MD for Pod C in order to inform him or her.  Cyndie Mull- Shan Padgett, [facility]

## 2015-01-10 NOTE — ED Notes (Addendum)
Per Tobi Bast[facility]Anna [facility], pt accepted to room, will call back when pt is able to be transferred over to hospital. Pt made aware and verbalized understanding.   "Per Donell SievertSpencer Simon, PA, pt meets inpatient criteria. Per Rosey BathKelly, AC, [facility] under the care of Dr Jama Flavorsobos. [facility] will call when pt is able to come over."

## 2015-01-10 NOTE — Progress Notes (Signed)
Patient alert and oriented x 4. Patient denies SI/HI/AVH. Patient states he has a headache 7/10 on the pain scale. Patient reports he has had tylenol over at the hospital in the ED. Patient states he has been having suicidal thoughts off and on with no plan. Patient was able to verbal contract with this Clinical research associatewriter. Patient states he lives at home with his significant other and his 36 year old son. Patient states his son is a stressor, but his partner is very supportive. 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 Patient states he would really wants to get his medications right and find out exactly what is wrong wether he has Bipolar or ADHD. He is reporting he thinks he has been misdiagnosed.  Patient skin is clean dry intact. No open areas observed. Patient has multiple tattoos on body.

## 2015-01-10 NOTE — ED Notes (Signed)
Report given to Kim, RN at [facility] 

## 2015-01-11 MED ORDER — DIVALPROEX SODIUM ER 250 MG PO TB24
250.0000 mg | ORAL_TABLET | Freq: Every day | ORAL | Status: DC
  Administered 2015-01-11 – 2015-01-13 (×3): 250 mg via ORAL
  Filled 2015-01-11 (×5): qty 1

## 2015-01-11 NOTE — Progress Notes (Signed)
  [facility] Discharge Plan :  Will you be returning to the same living situation after discharge:  Yes,  pt returning home At discharge, do you have transportation home?: Yes,  Pt's partner to provide transportation Do you have the ability to pay for your medications: Yes,  Pt provided with prescriptions  Release of information consent forms completed and in the chart;  Patient's signature needed at discharge.  Patient to Follow up at: Follow-up Information    Follow up with [facility] On 01/22/2015.   Specialty:  Behavioral Health   Why:  at 10:30am for your initial appointment for medication management and therapy.   Contact information:   [address] [place] [address] [phone]       Next level of care provider has [facility] to [facility] Link:no  Patient denies SI/HI: Yes,  Pt denies\    Safety Planning and Suicide Prevention discussed: Yes,  with partner; see SPE note for further details  Have you used any form of tobacco in the last 30 days? (Cigarettes, Smokeless Tobacco, Cigars, and/or Pipes): No  Has patient been referred to the Quitline?: N/A patient is not a smoker  Elaina HoopsCarter, Akim Watkinson M 01/11/2015, 3:28 PM

## 2015-01-11 NOTE — Plan of Care (Signed)
Problem: Alteration in mood Goal: LTG-Patient reports reduction in suicidal thoughts (Patient reports reduction in suicidal thoughts and is able to verbalize a safety plan for whenever patient is feeling suicidal)  Outcome: Progressing Pt denies SI and verbally contracts for safety.       

## 2015-01-11 NOTE — Plan of Care (Signed)
Problem: Diagnosis: Increased Risk For Suicide Attempt Goal: STG-Patient Will Attend All Groups On The Unit Outcome: Not Progressing Pt did not attend evening group on 01/10/15.

## 2015-01-11 NOTE — Progress Notes (Signed)
[facility] MD Progress Note  01/11/2015 6:03 PM Michael Higgins  MRN:  [id number] Subjective:  Patient has stated he feels better. He does endorse ongoing depression, but today improved compared to how he felt prior to admission. He does endorse vague sense of irritability. He is hoping for discharge soon. Objective : I have discussed case with treatment team and have met with patient. Patient reports feeling better , as above, and at this time  Presents with a fuller range of affect. At patient request we had phone meeting ( via phone speaker  ) with patient and patient's SO, Michael Higgins, with whom he lives .  Patient reports they had an argument earlier, via phone.  SO states that patient may be minimizing psychiatric symptoms, and feels that patient has had significant history of trauma in the past, and a tendency to irritability and avoidance of stressful situations . States that he thinks patient may have a personality disorder.  States that their relationship has been tense and strained due to patient's irritability. Patient minimizes , denies symptoms of mania , hypomania, or PTSD. Does, however, acknowledge irritability, feeling angered easily, but states that he is able to control this well, and for example be appropriate with customers, clients, etc . He does report, however, he feels his irritability is an issue , and agrees it has contributed to relationship tension. Thus far tolerating Celexa trial well. Patient denies any increased irritability or anger on SSRI trial, and at this time presents calm, not restless, no pressured speech, not irritable, pleasant, no racing thoughts. We discussed options and he expressed interest in DEPAKOTE ER trial to address irritability.   Principal Problem: Severe episode of recurrent major depressive disorder, without psychotic features ([place]) Diagnosis:   Patient Active Problem List   Diagnosis Date Noted  . Bipolar affective disorder ([place]) [F31.9] 01/10/2015   . Severe episode of recurrent major depressive disorder, without psychotic features ([place]) [F33.2]    Total Time spent with patient: 30 minutes     Past Medical History:  Past Medical History  Diagnosis Date  . Bipolar 1 disorder ([place])   . ADHD (attention deficit hyperactivity disorder)    History reviewed. No pertinent past surgical history. Family History: History reviewed. No pertinent family history. Social History:  History  Alcohol Use  . 1.8 oz/week  . 2 Cans of beer, 1 Glasses of wine per week     History  Drug Use  . 4.00 per week  . Special: Marijuana    Social History   Social History  . Marital Status: Single    Spouse Name: N/A  . Number of Children: N/A  . Years of Education: N/A   Social History Main Topics  . Smoking status: Never Smoker   . Smokeless tobacco: None  . Alcohol Use: 1.8 oz/week    2 Cans of beer, 1 Glasses of wine per week  . Drug Use: 4.00 per week    Special: Marijuana  . Sexual Activity: Yes    Birth Control/ Protection: None   Other Topics Concern  . None   Social History Narrative   Additional Social History:    Pain Medications: See PTA med list Prescriptions: See PTA med list Over the Counter: See PTA med list Name of Substance 1: THC 1 - Age of First Use: teens 1 - Amount (size/oz): Varies 1 - Frequency: 3-4x per week 1 - Duration: Past 5 years 1 - Last Use / Amount: 4 days ago  Sleep:  Good  Appetite:  Good  Current Medications: Current Facility-Administered Medications  Medication Dose Route Frequency Provider Last Rate Last Dose  . lamiVUDine (EPIVIR) tablet 300 mg  300 mg Oral QHS Harriet Butte, NP   300 mg at 01/10/15 2113   And  . abacavir (ZIAGEN) tablet 600 mg  600 mg Oral QHS Harriet Butte, NP   600 mg at 01/10/15 2113   And  . dolutegravir (TIVICAY) tablet 50 mg  50 mg Oral QHS Harriet Butte, NP   50 mg at 01/10/15 2112  . acetaminophen (TYLENOL) tablet 650 mg  650 mg Oral Q6H PRN Encarnacion Slates, NP   650 mg at 01/11/15 8315  . alum & mag hydroxide-simeth (MAALOX/MYLANTA) 200-200-20 MG/5ML suspension 30 mL  30 mL Oral Q4H PRN Encarnacion Slates, NP      . citalopram (CELEXA) tablet 20 mg  20 mg Oral Daily Jenne Campus, MD   20 mg at 01/11/15 0836  . magnesium hydroxide (MILK OF MAGNESIA) suspension 30 mL  30 mL Oral Daily PRN Encarnacion Slates, NP      . pantoprazole (PROTONIX) EC tablet 40 mg  40 mg Oral Daily Encarnacion Slates, NP   40 mg at 01/11/15 0836  . traZODone (DESYREL) tablet 50 mg  50 mg Oral QHS PRN Encarnacion Slates, NP   50 mg at 01/10/15 2113    Lab Results:  Results for orders placed or performed during the hospital encounter of 01/09/15 (from the past 48 hour(s))  Urine rapid drug screen (hosp performed) (Not at [facility])     Status: Abnormal   Collection Time: 01/09/15  9:46 PM  Result Value Ref Range   Opiates NONE DETECTED NONE DETECTED   Cocaine NONE DETECTED NONE DETECTED   Benzodiazepines NONE DETECTED NONE DETECTED   Amphetamines POSITIVE (A) NONE DETECTED   Tetrahydrocannabinol POSITIVE (A) NONE DETECTED   Barbiturates NONE DETECTED NONE DETECTED    Comment:        DRUG SCREEN FOR MEDICAL PURPOSES ONLY.  IF CONFIRMATION IS NEEDED FOR ANY PURPOSE, NOTIFY LAB WITHIN 5 DAYS.        LOWEST DETECTABLE LIMITS FOR URINE DRUG SCREEN Drug Class       Cutoff (ng/mL) Amphetamine      1000 Barbiturate      200 Benzodiazepine   176 Tricyclics       160 Opiates          300 Cocaine          300 THC              50   Comprehensive metabolic panel     Status: Abnormal   Collection Time: 01/09/15  9:51 PM  Result Value Ref Range   Sodium 138 135 - 145 mmol/L   Potassium 3.6 3.5 - 5.1 mmol/L   Chloride 104 101 - 111 mmol/L   CO2 28 22 - 32 mmol/L   Glucose, Bld 131 (H) 65 - 99 mg/dL   BUN 9 6 - 20 mg/dL   Creatinine, Ser 1.11 0.61 - 1.24 mg/dL   Calcium 9.6 8.9 - 10.3 mg/dL   Total Protein 7.1 6.5 - 8.1 g/dL   Albumin 4.2 3.5 - 5.0 g/dL   AST 20 15 - 41 U/L    ALT 34 17 - 63 U/L   Alkaline Phosphatase 41 38 - 126 U/L   Total Bilirubin 0.8 0.3 - 1.2 mg/dL   GFR calc  non Af Amer >60 >60 mL/min   GFR calc Af Amer >60 >60 mL/min    Comment: (NOTE) The eGFR has been calculated using the CKD EPI equation. This calculation has not been validated in all clinical situations. eGFR's persistently <60 mL/min signify possible Chronic Kidney Disease.    Anion gap 6 5 - 15  CBC     Status: None   Collection Time: 01/09/15  9:51 PM  Result Value Ref Range   WBC 9.4 4.0 - 10.5 K/uL   RBC 4.89 4.22 - 5.81 MIL/uL   Hemoglobin 15.3 13.0 - 17.0 g/dL   HCT 44.1 39.0 - 52.0 %   MCV 90.2 78.0 - 100.0 fL   MCH 31.3 26.0 - 34.0 pg   MCHC 34.7 30.0 - 36.0 g/dL   RDW 13.1 11.5 - 15.5 %   Platelets 167 150 - 400 K/uL  Ethanol (ETOH)     Status: None   Collection Time: 01/09/15  9:54 PM  Result Value Ref Range   Alcohol, Ethyl (B) <5 <5 mg/dL    Comment:        LOWEST DETECTABLE LIMIT FOR SERUM ALCOHOL IS 5 mg/dL FOR MEDICAL PURPOSES ONLY   Salicylate level     Status: None   Collection Time: 01/09/15  9:54 PM  Result Value Ref Range   Salicylate Lvl <6.0 2.8 - 30.0 mg/dL  Acetaminophen level     Status: Abnormal   Collection Time: 01/09/15  9:54 PM  Result Value Ref Range   Acetaminophen (Tylenol), Serum <10 (L) 10 - 30 ug/mL    Comment:        THERAPEUTIC CONCENTRATIONS VARY SIGNIFICANTLY. A RANGE OF 10-30 ug/mL MAY BE AN EFFECTIVE CONCENTRATION FOR MANY PATIENTS. HOWEVER, SOME ARE BEST TREATED AT CONCENTRATIONS OUTSIDE THIS RANGE. ACETAMINOPHEN CONCENTRATIONS >150 ug/mL AT 4 HOURS AFTER INGESTION AND >50 ug/mL AT 12 HOURS AFTER INGESTION ARE OFTEN ASSOCIATED WITH TOXIC REACTIONS.   Lithium level     Status: Abnormal   Collection Time: 01/09/15 11:12 PM  Result Value Ref Range   Lithium Lvl 0.22 (L) 0.60 - 1.20 mmol/L    Physical Findings: AIMS: Facial and Oral Movements Muscles of Facial Expression: None, normal Lips and Perioral  Area: None, normal Jaw: None, normal Tongue: None, normal,Extremity Movements Upper (arms, wrists, hands, fingers): None, normal Lower (legs, knees, ankles, toes): None, normal, Trunk Movements Neck, shoulders, hips: None, normal, Overall Severity Severity of abnormal movements (highest score from questions above): None, normal Incapacitation due to abnormal movements: None, normal Patient's awareness of abnormal movements (rate only patient's report): No Awareness, Dental Status Current problems with teeth and/or dentures?: No Does patient usually wear dentures?: No  CIWA:  CIWA-Ar Total: 2 COWS:  COWS Total Score: 1  Musculoskeletal: Strength & Muscle Tone: within normal limits Gait & Station: normal Patient leans: N/A  Psychiatric Specialty Exam: ROS denies headache, no nausea, no vomiting   Blood pressure 122/86, pulse 108, temperature 98.5 F (36.9 C), temperature source Oral, resp. rate 16, height 5' 11"  (1.803 m), weight 196 lb (88.905 kg), SpO2 99 %.Body mass index is 27.35 kg/(m^2).  General Appearance: Well Groomed  Engineer, water::  Good  Speech:  Normal Rate no rapid or pressured speech  Volume:  Normal  Mood:  still depressed , but improved  Affect:  states feels irritable often, but at this time pleasant , calm  Thought Process:  Linear no racing thoughts or flight of ideations  Orientation:  Full (Time, Place, and  Person)  Thought Content:  denies hallucinations, no delusions  no grandiosity   Suicidal Thoughts:  No at present denies suicidal ideations, denies self injurious ideations  Homicidal Thoughts:  No denies any violent or homicidal ideations  Memory:  recent and remote grossly intact  Judgement:  Fair  Insight:  improving   Psychomotor Activity:  Normal  Concentration:  Good  Recall:  Good  Fund of Knowledge:Good  Language: Good  Akathisia:  Negative  Handed:  Right  AIMS (if indicated):     Assets:  Communication Skills Desire for  Improvement Resilience  ADL's:  Intact  Cognition: WNL  Sleep:  Number of Hours: 6.75  Assessment - patient reports depression, currently starting to improve, and thus far tolerating Celexa trial well . SO expresses concern that in addition to depression, patient struggles with irritability, and that he has had significant trauma in his childhood and past history. SO states that patient's irritability has contributed to relationship tension. At this time patient not presenting with any clear symptoms of hypomania , and is not exhibiting pressured speech, expansive affect, flight of ideations, or grandiosity. He is not restless and slept well. He does report he has a tendency towards irritability , anger, and acknowledges he feels it has affected his relationships . As such , agrees to Depakote ER trial , to address irritability Treatment Plan Summary: Daily contact with patient to assess and evaluate symptoms and progress in treatment, Medication management, Plan inpatient admission and medications as below  Continue to encourage milieu , group participation to address coping skills, symptom reduction Continue antiretroviral medications to address HIV  Continue Celexa 20 mgrs QDAY for depression Start Depakote ER, initially at 250 mgrs QHS to determine tolerance, for irritability- consider titration if well tolerated. We have discussed medication side effects and risks with patient Patient and SO interested in family meeting- will tentatively schedule for Monday COBOS, FERNANDO 01/11/2015, 6:03 PM

## 2015-01-11 NOTE — Progress Notes (Signed)
D: Pt has depressed affect and mood.  Pt was in his room in bed upon initial approach.  When asked what his daily goal was, pt reports "I don't have any goals."  Pt reports his partner came to visit today and that it was a good visit.  Pt denies SI/HI, denies hallucinations, reports pain from headache of 5/10.  Pt has had minimal interactions with peers.  Pt did not attend evening group tonight.   A: Introduced self to pt.  Met with pt 1:1 and provided support and encouragement. Actively listened to pt.  PO fluids encouraged and provided.  Medications administered per order.  PRN medication administered for pain and sleep. R: Pt is compliant with medications.  Pt verbally contracts for safety and reports that he will notify staff of needs and concerns.  Will continue to monitor and assess.

## 2015-01-11 NOTE — [facility] Group Notes (Signed)
[facility] LCSW Group Therapy 01/11/2015 1:15pm  Type of Therapy: Group Therapy- Feelings Around Relapse and Recovery  Participation Level: Active   Participation Quality:  Appropriate  Affect:  Appropriate  Cognitive: Alert and Oriented   Insight:  Developing   Engagement in Therapy: Developing/Improving and Engaged   Modes of Intervention: Clarification, Confrontation, Discussion, Education, Exploration, Limit-setting, Orientation, Problem-solving, Rapport Building, Dance movement psychotherapisteality Testing, Socialization and Support  Summary of Progress/Problems: The topic for today was feelings about relapse. The group discussed what relapse prevention is to them and identified triggers that they are on the path to relapse. Members also processed their feeling towards relapse and were able to relate to common experiences. Group also discussed coping skills that can be used for relapse prevention.  Pt participated in group discussion, taking ownership of his difficulty communicating with his partner. Pt presented as irritable when discussing issues with his partner. Pt identifies his past and the way communication was modeled as problematic in how he communicates as an adult. He is unable to identify the type of dynamic he would like to have with a therapist in order to make therapeutic gains.   Therapeutic Modalities:   Cognitive Behavioral Therapy Solution-Focused Therapy Assertiveness Training Relapse Prevention Therapy    Lamar SprinklesLauren Carter, LCSWA [phone] 01/11/2015 3:20 PM

## 2015-01-11 NOTE — Progress Notes (Signed)
Patient ID: Michael Higgins, male   DOB: 05/01/1980, 34 y.o.   MRN: [id number]   Pt currently presents with a flat affect and guarded behavior. Per self inventory, pt rates depression at a 7, hopelessness 2 and anxiety 2. Pt's daily goal is to "n/a" and they intend to do so by "n/a." Pt reports poor sleep, a fair appetite, low energy and good concentration.   Pt provided with medications per providers orders. Pt's labs and vitals were monitored throughout the day. Pt supported emotionally and encouraged to express concerns and questions. Pt educated on medications and assertiveness techniques.  Pt's safety ensured with 15 minute and environmental checks. Pt currently denies SI/HI and A/V hallucinations. Pt verbally agrees to seek staff if SI/HI or A/VH occurs and to consult with staff before acting on these thoughts. Pt reports getting into an argument with his partner this afternoon. Pt reports this delaying his discharge plan, he plans to have a family meeting on Monday. Pt encouraged to write down topics he wants to tell his partner during this meeting, pt agrees to try. Will continue POC.

## 2015-01-11 NOTE — [facility] Group Notes (Signed)
[facility]  01/11/2015 8:45 AM  Pt did not attend, declined invitation.   Chad CordialLauren Carter, LCSWA 01/11/2015 9:30 AM

## 2015-01-11 NOTE — Progress Notes (Signed)
D: Pt has depressed affect and mood.  He reports his day was "good" and that he went to group earlier today.  He has been more visible in the milieu tonight and he attended evening group.  Pt denies SI/HI, denies hallucinations, denies pain.   A:  Met with pt 1:1 and provided support and encouragement.  Actively listened to pt.  Medications administered per order.  PRN medication administered for sleep. R: Pt is compliant with medications.  Pt verbally contracts for safety.  Will continue to monitor and assess.

## 2015-01-11 NOTE — Progress Notes (Signed)
Adult Psychoeducational Group Note  Date:  01/11/2015 Time:  9:34 PM  Group Topic/Focus:  Wrap-Up Group:   The focus of this group is to help patients review their daily goal of treatment and discuss progress on daily workbooks.  Participation Level:  Active  Participation Quality:  Appropriate  Affect:  Appropriate  Cognitive:  Alert  Insight: Appropriate  Engagement in Group:  Engaged  Modes of Intervention:  Discussion  Additional Comments:  Patient stated having a "okay day". Patient's goal was to go to group.   Michael Higgins L Demetrica Zipp 01/11/2015, 9:34 PM

## 2015-01-12 DIAGNOSIS — F332 Major depressive disorder, recurrent severe without psychotic features: Secondary | ICD-10-CM

## 2015-01-12 MED ORDER — SUMATRIPTAN SUCCINATE 25 MG PO TABS
25.0000 mg | ORAL_TABLET | Freq: Once | ORAL | Status: AC
  Administered 2015-01-12: 25 mg via ORAL
  Filled 2015-01-12: qty 1

## 2015-01-12 MED ORDER — FLUTICASONE PROPIONATE 50 MCG/ACT NA SUSP
1.0000 | Freq: Two times a day (BID) | NASAL | Status: DC
  Administered 2015-01-12 – 2015-01-14 (×5): 1 via NASAL
  Filled 2015-01-12 (×2): qty 16

## 2015-01-12 MED ORDER — GUAIFENESIN ER 600 MG PO TB12
600.0000 mg | ORAL_TABLET | Freq: Two times a day (BID) | ORAL | Status: DC
  Administered 2015-01-12 – 2015-01-14 (×4): 600 mg via ORAL
  Filled 2015-01-12 (×6): qty 1

## 2015-01-12 MED ORDER — TRAZODONE HCL 100 MG PO TABS
100.0000 mg | ORAL_TABLET | Freq: Every evening | ORAL | Status: DC | PRN
  Administered 2015-01-12 – 2015-01-13 (×2): 100 mg via ORAL
  Filled 2015-01-12: qty 1

## 2015-01-12 NOTE — Progress Notes (Signed)
Adult Psychoeducational Group Note  Date:  01/12/2015 Time:  9:57 PM  Group Topic/Focus:  Wrap-Up Group:   The focus of this group is to help patients review their daily goal of treatment and discuss progress on daily workbooks.  Participation Level:  Active  Participation Quality:  Appropriate  Affect:  Appropriate  Cognitive:  Alert and Appropriate  Insight: Appropriate and Good  Engagement in Group:  Engaged  Modes of Intervention:  Discussion  Additional Comments:  Patient confirmed attending groups throughout the day.  Patient reported wanting to work towards accomplishing goals for his discharge, due to be content about having a home to return to.   Elmore GuiseSLOAN, Darryl Willner N 01/12/2015, 9:57 PM

## 2015-01-12 NOTE — Progress Notes (Signed)
Patient ID: Michael Higgins, male   DOB: 05/11/1980, 34 y.o.   MRN: [id number]  Adult Psychoeducational Group Note  Date:  01/12/2015 Time: 09:40am  Group Topic/Focus:  Healthy Communication:   The focus of this group is to discuss communication, barriers to communication, as well as healthy ways to communicate with others.  Participation Level:  Active  Participation Quality:  Appropriate  Affect:  Appropriate  Cognitive:  Appropriate  Insight: Improving  Engagement in Group:  Engaged and Improving  Modes of Intervention:  Activity, Discussion, Education, Orientation and Support  Additional Comments:  Pt able to identify one healthy and one unhealthy communication tool that [facility].   Aurora Maskwyman, Libni Fusaro E 01/12/2015, 11:44 AM

## 2015-01-12 NOTE — Progress Notes (Signed)
DAR NOTE: Patient mood and affect was depressed. Denies pain, auditory and visual hallucinations.  Rates depression at 2/10, hopelessness at 1/10, and anxiety at 2/10.  Maintained on routine safety checks.  Medications given as prescribed.  Support and encouragement offered as needed.  Attended group and participated.  States goal for today is Research officer, political party"Communication."

## 2015-01-12 NOTE — Progress Notes (Signed)
[facility]  01/12/2015 1:08 PM Michael Higgins  MRN:  [id number]   Subjective:   States ''I am still depressed."  Objective:Michael Higgins is awake, alert and oriented X4 , found attending group session.  Denies suicidal or homicidal ideation. Denies auditory or visual hallucination and does not appear to be responding to internal stimuli. Patient reports interacts well with staff and others. Patient reports he is medication compliant without mediation side effects. Report learning new coping skills. States his  depression 8/10. Patient states "Not feeling well and I have a headache that been going on for  the past 3 days." Reports good appetite and resting well okay. Patient report he is anxious regarding the up coming discharge. Support, encouragement and reassurance was provided.   Principal Problem: Severe episode of recurrent major depressive disorder, without psychotic features (HCC) Diagnosis:   Patient Active Problem List   Diagnosis Date Noted  . Bipolar affective disorder (HCC) [F31.9] 01/10/2015  . Severe episode of recurrent major depressive disorder, without psychotic features (HCC) [F33.2]    Total Time spent with patient: 30 minutes     Past Medical History:  Past Medical History  Diagnosis Date  . Bipolar 1 disorder (HCC)   . ADHD (attention deficit hyperactivity disorder)    History reviewed. No pertinent past surgical history. Family History: History reviewed. No pertinent family history. Social History:  History  Alcohol Use  . 1.8 oz/week  . 2 Cans of beer, 1 Glasses of wine per week     History  Drug Use  . 4.00 per week  . Special: Marijuana    Social History   Social History  . Marital Status: Single    Spouse Name: N/A  . Number of Children: N/A  . Years of Education: N/A   Social History Main Topics  . Smoking status: Never Smoker   . Smokeless tobacco: None  . Alcohol Use: 1.8 oz/week    2 Cans of beer, 1 Glasses of wine per week   . Drug Use: 4.00 per week    Special: Marijuana  . Sexual Activity: Yes    Birth Control/ Protection: None   Other Topics Concern  . None   Social History Narrative   Additional Social History:    Pain Medications: See PTA med list Prescriptions: See PTA med list Over the Counter: See PTA med list Name of Substance 1: THC 1 - Age of First Use: teens 1 - Amount (size/oz): Varies 1 - Frequency: 3-4x per week 1 - Duration: Past 5 years 1 - Last Use / Amount: 4 days ago  Sleep: Good  Appetite:  Good  Current Medications: Current Facility-Administered Medications  Medication Dose Route Frequency Provider Last Rate Last Dose  . lamiVUDine (EPIVIR) tablet 300 mg  300 mg Oral QHS Worthy FlankIjeoma E Nwaeze, NP   300 mg at 01/11/15 2108   And  . abacavir (ZIAGEN) tablet 600 mg  600 mg Oral QHS Worthy FlankIjeoma E Nwaeze, NP   600 mg at 01/11/15 2107   And  . dolutegravir (TIVICAY) tablet 50 mg  50 mg Oral QHS Worthy FlankIjeoma E Nwaeze, NP   50 mg at 01/11/15 2107  . acetaminophen (TYLENOL) tablet 650 mg  650 mg Oral Q6H PRN Sanjuana KavaAgnes I Nwoko, NP   650 mg at 01/12/15 0607  . alum & mag hydroxide-simeth (MAALOX/MYLANTA) 200-200-20 MG/5ML suspension 30 mL  30 mL Oral Q4H PRN Sanjuana KavaAgnes I Nwoko, NP      . citalopram (CELEXA)  tablet 20 mg  20 mg Oral Daily Craige Cotta, MD   20 mg at 01/12/15 0809  . divalproex (DEPAKOTE ER) 24 hr tablet 250 mg  250 mg Oral QHS Craige Cotta, MD   250 mg at 01/11/15 2107  . fluticasone (FLONASE) 50 MCG/ACT nasal spray 1 spray  1 spray Each Nare BID Oneta Rack, NP      . guaiFENesin (MUCINEX) 12 hr tablet 600 mg  600 mg Oral BID Oneta Rack, NP      . magnesium hydroxide (MILK OF MAGNESIA) suspension 30 mL  30 mL Oral Daily PRN Sanjuana Kava, NP      . pantoprazole (PROTONIX) EC tablet 40 mg  40 mg Oral Daily Sanjuana Kava, NP   40 mg at 01/12/15 0809  . SUMAtriptan (IMITREX) tablet 25 mg  25 mg Oral Once Oneta Rack, NP      . traZODone (DESYREL) tablet 100 mg  100 mg Oral  QHS PRN Oneta Rack, NP        Lab Results:  No results found for this or any previous visit (from the past 48 hour(s)).  Physical Findings: AIMS: Facial and Oral Movements Muscles of Facial Expression: None, normal Lips and Perioral Area: None, normal Jaw: None, normal Tongue: None, normal,Extremity Movements Upper (arms, wrists, hands, fingers): None, normal Lower (legs, knees, ankles, toes): None, normal, Trunk Movements Neck, shoulders, hips: None, normal, Overall Severity Severity of abnormal movements (highest score from questions above): None, normal Incapacitation due to abnormal movements: None, normal Patient's awareness of abnormal movements (rate only patient's report): No Awareness, Dental Status Current problems with teeth and/or dentures?: No Does patient usually wear dentures?: No  CIWA:  CIWA-Ar Total: 2 COWS:  COWS Total Score: 1  Musculoskeletal: Strength & Muscle Tone: within normal limits Gait & Station: normal Patient leans: N/A  Psychiatric Specialty Exam: Review of Systems  Constitutional: Positive for malaise/fatigue.  HENT: Positive for congestion.   Neurological: Positive for headaches.  Psychiatric/Behavioral: Positive for depression. Negative for suicidal ideas. The patient is nervous/anxious and has insomnia.   All other systems reviewed and are negative.  denies headache, no nausea, no vomiting   Blood pressure 128/99, pulse 106, temperature 97.8 F (36.6 C), temperature source Oral, resp. rate 16, height  (1.803 m), weight 88.905 kg (196 lb), SpO2 99 %.Body mass index is 27.35 kg/(m^2).  General Appearance: Casual and Well Groomed  Eye Contact::  Good  Speech:  Normal Rate   Volume:  Normal  Mood:  still depressed , but improved  Affect:  states feels irritable often, but at this time pleasant , calm  Thought Process:  Coherent, Goal Directed, Linear and Logical  Orientation:  Full (Time, Place, and Person)  Thought Content:   denies hallucinations, no delusions   Suicidal Thoughts:  No at present denies suicidal ideations, denies self injurious ideations  Homicidal Thoughts:  No denies any violent or homicidal ideations  Memory:  recent and remote grossly intact  Judgement:  Fair  Insight:  improving   Psychomotor Activity:  Normal  Concentration:  Good  Recall:  Good  Fund of Knowledge:Good  Language: Good  Akathisia:  Negative  Handed:  Right  AIMS (if indicated):     Assets:  Communication Skills Desire for Improvement Resilience  ADL's:  Intact  Cognition: WNL  Sleep:  Number of Hours: 4.75     I agree with current treatment plan on 01/15/2015, Patient seen  face-to-face for psychiatric evaluation follow-up,  Advanced Practice Provider. C.Withthrow Reviewed the information documented and agree with the treatment plan.  Treatment Plan Summary: Daily contact with patient to assess and evaluate symptoms and progress in treatment, Medication management, Plan inpatient admission and medications as below   Start Mucinex 600 mg BID for congestion  Start Flonase 50 mcg/ACT congestion Start Imitrex 25 mg PO for headache/sinus pressure Increased Trazodone to 100 mg for insomnia Continue to encourage milieu , group participation to address coping skills, symptom reduction Continue antiretroviral medications to address HIV  Continue Celexa 20 mgrs QDAY for depression Start Depakote ER, initially at 250 mgrs QHS to determine tolerance, for irritability- consider titration if well tolerated. We have discussed medication side effects and risks with patient Patient and SO interested in family meeting- will tentatively schedule for Monday  Oneta Rack FNP- [facility] 01/12/2015, 1:08 PM
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
## 2015-01-13 NOTE — Plan of Care (Signed)
Problem: Diagnosis: Increased Risk For Suicide Attempt Goal: STG-Patient Will Comply With Medication Regime Outcome: Progressing Pt has been compliant with all medications tonight.       

## 2015-01-13 NOTE — Progress Notes (Signed)
[facility] MD Progress Note  01/13/2015 10:55 AM COURTNEY FENLON  MRN:  [id number]   Subjective:  Patient States ''I am feeling better today."  Objective:Ysabel W Deeg is awake, alert and oriented X4 , found resting in bedroom.  Denies suicidal or homicidal ideation. Denies auditory or visual hallucination and does not appear to be responding to internal stimuli. Patient reports interacts well with staff and others. Patient reports he is medication compliant without mediation side effects. Reports his  depression 5/10. Patient states "He is a little anxious about discharge tomorrow with his partner States "that the group session should be helpfully so that he can understand where all theses emotions are coming from".  Reports good appetite and resting well okay states in need total darkness and quiet. Patient report he is anxious regarding the up coming discharge. Support, encouragement and reassurance was provided.   Principal Problem: Severe episode of recurrent major depressive disorder, without psychotic features (HCC) Diagnosis:   Patient Active Problem List   Diagnosis Date Noted  . Bipolar affective disorder (HCC) [F31.9] 01/10/2015  . Severe episode of recurrent major depressive disorder, without psychotic features (HCC) [F33.2]    Total Time spent with patient: 30 minutes     Past Medical History:  Past Medical History  Diagnosis Date  . Bipolar 1 disorder (HCC)   . ADHD (attention deficit hyperactivity disorder)    History reviewed. No pertinent past surgical history. Family History: History reviewed. No pertinent family history. Social History:  History  Alcohol Use  . 1.8 oz/week  . 2 Cans of beer, 1 Glasses of wine per week     History  Drug Use  . 4.00 per week  . Special: Marijuana    Social History   Social History  . Marital Status: Single    Spouse Name: N/A  . Number of Children: N/A  . Years of Education: N/A   Social History Main Topics  . Smoking  status: Never Smoker   . Smokeless tobacco: None  . Alcohol Use: 1.8 oz/week    2 Cans of beer, 1 Glasses of wine per week  . Drug Use: 4.00 per week    Special: Marijuana  . Sexual Activity: Yes    Birth Control/ Protection: None   Other Topics Concern  . None   Social History Narrative   Additional Social History:    Pain Medications: See PTA med list Prescriptions: See PTA med list Over the Counter: See PTA med list Name of Substance 1: THC 1 - Age of First Use: teens 1 - Amount (size/oz): Varies 1 - Frequency: 3-4x per week 1 - Duration: Past 5 years 1 - Last Use / Amount: 4 days ago  Sleep: Good  Appetite:  Good  Current Medications: Current Facility-Administered Medications  Medication Dose Route Frequency Provider Last Rate Last Dose  . lamiVUDine (EPIVIR) tablet 300 mg  300 mg Oral QHS Worthy Flank, NP   300 mg at 01/12/15 2103   And  . abacavir (ZIAGEN) tablet 600 mg  600 mg Oral QHS Worthy Flank, NP   600 mg at 01/12/15 2103   And  . dolutegravir (TIVICAY) tablet 50 mg  50 mg Oral QHS Worthy Flank, NP   50 mg at 01/12/15 2103  . acetaminophen (TYLENOL) tablet 650 mg  650 mg Oral Q6H PRN Sanjuana Kava, NP   650 mg at 01/13/15 0827  . alum & mag hydroxide-simeth (MAALOX/MYLANTA) 200-200-20 MG/5ML suspension 30 mL  30 mL Oral Q4H PRN Sanjuana Kava, NP      . citalopram (CELEXA) tablet 20 mg  20 mg Oral Daily Craige Cotta, MD   20 mg at 01/13/15 1610  . divalproex (DEPAKOTE ER) 24 hr tablet 250 mg  250 mg Oral QHS Craige Cotta, MD   250 mg at 01/12/15 2103  . fluticasone (FLONASE) 50 MCG/ACT nasal spray 1 spray  1 spray Each Nare BID Oneta Rack, NP   1 spray at 01/13/15 0829  . guaiFENesin (MUCINEX) 12 hr tablet 600 mg  600 mg Oral BID Oneta Rack, NP   600 mg at 01/13/15 9604  . magnesium hydroxide (MILK OF MAGNESIA) suspension 30 mL  30 mL Oral Daily PRN Sanjuana Kava, NP      . pantoprazole (PROTONIX) EC tablet 40 mg  40 mg Oral Daily  Sanjuana Kava, NP   40 mg at 01/13/15 5409  . traZODone (DESYREL) tablet 100 mg  100 mg Oral QHS PRN Oneta Rack, NP   100 mg at 01/12/15 2103    Lab Results:  No results found for this or any previous visit (from the past 48 hour(s)).  Physical Findings: AIMS: Facial and Oral Movements Muscles of Facial Expression: None, normal Lips and Perioral Area: None, normal Jaw: None, normal Tongue: None, normal,Extremity Movements Upper (arms, wrists, hands, fingers): None, normal Lower (legs, knees, ankles, toes): None, normal, Trunk Movements Neck, shoulders, hips: None, normal, Overall Severity Severity of abnormal movements (highest score from questions above): None, normal Incapacitation due to abnormal movements: None, normal Patient's awareness of abnormal movements (rate only patient's report): No Awareness, Dental Status Current problems with teeth and/or dentures?: No Does patient usually wear dentures?: No  CIWA:  CIWA-Ar Total: 2 COWS:  COWS Total Score: 1  Musculoskeletal: Strength & Muscle Tone: within normal limits Gait & Station: normal Patient leans: N/A  Psychiatric Specialty Exam: Review of Systems  HENT: Positive for congestion.   Psychiatric/Behavioral: Positive for depression. Negative for suicidal ideas. The patient is nervous/anxious and has insomnia.   All other systems reviewed and are negative.  denies headache, no nausea, no vomiting   Blood pressure 108/63, pulse 108, temperature 98.7 F (37.1 C), temperature source Oral, resp. rate 16, height  (1.803 m), weight 88.905 kg (196 lb), SpO2 99 %.Body mass index is 27.35 kg/(m^2).  General Appearance: Casual and Well Groomed  Eye Contact::  Good  Speech:  Normal Rate   Volume:  Normal  Mood:  still depressed , but improving 5/10  Affect:  states feels irritable often, but at this time pleasant , calm  Thought Process:  Coherent, Goal Directed, Linear and Logical  Orientation:  Full (Time, Place,  and Person)  Thought Content:  denies hallucinations, no delusions   Suicidal Thoughts:  No at present denies suicidal ideations, denies self injurious ideations  Homicidal Thoughts:  No denies any violent or homicidal ideations  Memory:  recent and remote grossly intact  Judgement:  Fair  Insight:  improving   Psychomotor Activity:  Normal  Concentration:  Good  Recall:  Good  Fund of Knowledge:Good  Language: Good  Akathisia:  Negative  Handed:  Right  AIMS (if indicated):     Assets:  Communication Skills Desire for Improvement Resilience  ADL's:  Intact  Cognition: WNL  Sleep:  Number of Hours: 6.75     I agree with current treatment plan on 01/13/2015, Patient seen face-to-face for psychiatric  evaluation follow-up,  Advanced Practice Provider. C.Withthrow Reviewed the information documented and agree with the treatment plan.  Treatment Plan Summary: Daily contact with patient to assess and evaluate symptoms and progress in treatment, Medication management, Plan inpatient admission and medications as below   Continue Mucinex 600 mg BID for congestion  Continue Flonase 50 mcg/ACT congestion Start Imitrex 25 mg PO for headache/sinus pressure / d'c Continued Trazodone to 100 mg for insomnia Continue to encourage milieu , group participation to address coping skills, symptom reduction Continue antiretroviral medications to address HIV  Continue Celexa 20 mgrs QDAY for depression Start Depakote ER, initially at 250 mgrs QHS to determine tolerance, for irritability- consider titration if well tolerated. We have discussed medication side effects and risks with patient Patient and SO interested in family meeting- will tentatively schedule for Monday  Oneta Rackanika N Tacha Manni FNP- [facility] 01/13/2015, 10:55 AM

## 2015-01-13 NOTE — Progress Notes (Signed)
D: Pt has appropriate affect and pleasant mood.  He reports his day was "good."  Pt reports his goal was to go to group and that he "went to all 3 groups" today.  Pt reports he had a good visit tonight.  Pt denies SI/HI, denies hallucinations, denies pain.  Pt has been visible in milieu interacting with peers and staff appropriately.  Pt attended evening group.   A: Met with pt 1:1 and provided support and encouragement.  Actively listened to pt.  Medications administered per order.  PRN medication administered for sleep. R: Pt is compliant with medications.  Pt verbally contracts for safety.  Will continue to monitor and assess.

## 2015-01-13 NOTE — Progress Notes (Signed)
Adult Psychoeducational Group Note  Date:  01/13/2015 Time:  10:01 PM  Group Topic/Focus:  Wrap-Up Group:   The focus of this group is to help patients review their daily goal of treatment and discuss progress on daily workbooks.  Participation Level:  Active  Participation Quality:  Appropriate  Affect:  Appropriate  Cognitive:  Alert  Insight: Appropriate  Engagement in Group:  Engaged  Modes of Intervention:  Discussion  Additional Comments:  Patient stated having a good day. Patient stated his goal for today was to attend all groups. Patient stated he is ready for discharge tomorrow.   Colsen Modi L Sadey Yandell 01/13/2015, 10:01 PM
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
## 2015-01-13 NOTE — [facility] Group Notes (Signed)
[facility] Group Notes:  (Nursing/MHT/Case Management/Adjunct)  Date:  01/13/2015  Time:  2:32 PM  Type of Therapy:  Psychoeducational Skills  Participation Level:  Minimal  Participation Quality:  Attentive  Affect:  Appropriate  Cognitive:  Appropriate  Insight:  Appropriate  Engagement in Group:  Engaged  Modes of Intervention:  Discussion and Education  Summary of Progress/Problems: The purpose of this group is to discuss healthy support systems. Patient's also were able to focus on perspectives and think about their irrational thoughts and asked to name one. Michael Higgins attended group and reports that his irrational thought is having trouble expressing himself.   [name] 01/13/2015, 2:32 PM

## 2015-01-14 MED ORDER — ABACAVIR-DOLUTEGRAVIR-LAMIVUD 600-50-300 MG PO TABS: 1.0000 | ORAL_TABLET | Freq: Every day | ORAL | Status: AC

## 2015-01-14 MED ORDER — DIVALPROEX SODIUM ER 500 MG PO TB24: 500.0000 mg | ORAL_TABLET | Freq: Every day | ORAL | Status: AC

## 2015-01-14 MED ORDER — CITALOPRAM HYDROBROMIDE 20 MG PO TABS: 20.0000 mg | ORAL_TABLET | Freq: Every day | ORAL | Status: AC

## 2015-01-14 MED ORDER — OMEPRAZOLE 20 MG PO CPDR: 20.0000 mg | DELAYED_RELEASE_CAPSULE | Freq: Every day | ORAL | Status: AC | PRN

## 2015-01-14 MED ORDER — TRAZODONE HCL 100 MG PO TABS: 100.0000 mg | ORAL_TABLET | Freq: Every evening | ORAL | Status: DC | PRN

## 2015-01-14 MED ORDER — FLUTICASONE PROPIONATE 50 MCG/ACT NA SUSP: 1.0000 | Freq: Two times a day (BID) | NASAL | Status: AC

## 2015-01-14 MED ORDER — DIVALPROEX SODIUM ER 500 MG PO TB24
500.0000 mg | ORAL_TABLET | Freq: Every day | ORAL | Status: DC
  Filled 2015-01-14: qty 1

## 2015-01-14 NOTE — [facility] Suicide Risk Assessment (Signed)
[facility]   Demographic Factors:  34 year old single male, one son aged 36, self employed, lives with partner   Total Time spent with patient: 30 minutes  Musculoskeletal: Strength & Muscle Tone: within normal limits Gait & Station: normal Patient leans: N/A  Psychiatric Specialty Exam: Physical Exam  ROS  Blood pressure 124/77, pulse 88, temperature 98.1 F (36.7 C), temperature source Oral, resp. rate 16, height 5\' 11"  (1.803 m), weight 196 lb (88.905 kg), SpO2 99 %.Body mass index is 27.35 kg/(m^2).  General Appearance: Well Groomed  Patent attorneyye Contact::  Good  Speech:  Normal Rate409  Volume:  Normal  Mood:  Euthymic  Affect:  Appropriate  Thought Process:  Linear  Orientation:  Full (Time, Place, and Person)  Thought Content:  denies hallucinations, no delusions  Suicidal Thoughts:  No- denies any suicidal ideations or any self injurious ideations  Homicidal Thoughts:  No  Memory:  recent and remote grossly intact   Judgement:  Other:  improved   Insight:  improving   Psychomotor Activity:  Normal  Concentration:  Good  Recall:  Good  Fund of Knowledge:Good  Language: Good  Akathisia:  Negative  Handed:  Right  AIMS (if indicated):     Assets:  Communication Skills Desire for Improvement Housing Resilience Social Support  Sleep:  Number of Hours: 5  Cognition: WNL  ADL's:  Intact   Have you used any form of tobacco in the last 30 days? (Cigarettes, Smokeless Tobacco, Cigars, and/or Pipes): No  Has this patient used any form of tobacco in the last 30 days? (Cigarettes, Smokeless Tobacco, Cigars, and/or Pipes) No  Mental Status Per Nursing Assessment::   On Admission:     At this time patient improved compared to admission- currently fully alert, attentive, mood euthymic, affect full in range, no thought disorder, no SI or HI, no psychotic symptoms, future oriented  Loss Factors: Relationship stressors   Historical Factors: History  of depression, irritability, but no clear history of mania .  Risk Reduction Factors:   Responsible for children under 36 years of age, Sense of responsibility to family, Employed, Living with another person, especially a relative, Positive social support and Positive coping skills or problem solving skills  Continued Clinical Symptoms:  At this time patient much improved compared to admission. Presenting stable, euthymic.  Denies any medication side effects, and we have reviewed side effect profile for Celexa and Depakote ER. The latter was started for history of irritability, some impulsivity. Thus far he is tolerating trial well. At patient's request, we had couples session with his SO, Jeri ModenaJeremiah, who corroborates patient is doing well, and seems " like his normal self again". SO supportive and involved in patient's care .   Cognitive Features That Contribute To Risk:  No gross cognitive deficits noted upon discharge. Is alert , attentive, and oriented x 3   Suicide Risk:  Mild:  Suicidal ideation of limited frequency, intensity, duration, and specificity.  There are no identifiable plans, no associated intent, mild dysphoria and related symptoms, good self-control (both objective and subjective assessment), few other risk factors, and identifiable protective factors, including available and accessible social support.  Principal Problem: Severe episode of recurrent major depressive disorder, without psychotic features [facility](HCC) Discharge Diagnoses:  Patient Active Problem List   Diagnosis Date Noted  . Bipolar affective disorder (HCC) [F31.9] 01/10/2015  . Severe episode of recurrent major depressive disorder, without psychotic features (HCC) [F33.2]     Follow-up Information  Follow up with [facility] On 01/22/2015.   Specialty:  Behavioral Health   Why:  at 10:30am for your initial appointment for medication management and therapy.   Contact information:    [address] [place] [address] [phone]       Plan Of Care/Follow-up recommendations:  Activity:  as tolerated  Diet:  Regular Tests:  NA Other:  see below  Is patient on multiple antipsychotic therapies at discharge:  No   Has Patient had three or more failed trials of antipsychotic monotherapy by history:  No  Recommended Plan for Multiple Antipsychotic Therapies: NA Patient is leaving in good spirits. Plans to return home . Plans to follow up as above - side effects discussed. Of note, valproic acid serum level not requested inpatient as patient started on low initial dose. Patient states he will discuss ongoing management and serum monitoring with his PCP. Patient has established  ID/ medical outpatient services for management of HIV   COBOS, FERNANDO 01/14/2015, 10:55 AM

## 2015-01-14 NOTE — Progress Notes (Signed)
Discharge note: Pt received both written and verbal discharge instructions. Pt verbalized understanding of discharge instructions. Pt agreed to f/u appt and med regimen. Pt received belongings and prescriptions at time of discharge. Pt refused prescription for trazodone. Prescriptions was voided and place in shred box.. [facility].

## 2015-01-14 NOTE — Progress Notes (Signed)
D: Pt has appropriate affect and pleasant mood.  Pt reports his day was "good" and that he had a good visit.  Pt reports he will probably discharge tomorrow after his family session.  He reports he feels safe to discharge tomorrow.  Pt reports he attended groups throughout the day.  Pt denies SI/HI, denies hallucinations, reports pain of 4/10 from headache.  Pt has been visible in milieu interacting with peers and staff appropriately.  Pt attended evening group.   A: Met with pt 1:1 and provided support and encouragement.  Actively listened to pt.  Medications administered per order.  PRN medication administered for pain and sleep. R: Pt is compliant with medications.  Pt verbally contracts for safety.  Will continue to monitor and assess.
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
## 2015-01-31 ENCOUNTER — Encounter (HOSPITAL_COMMUNITY): Payer: Self-pay | Admitting: Psychiatry

## 2015-01-31 ENCOUNTER — Other Ambulatory Visit (HOSPITAL_COMMUNITY): Payer: 59 | Attending: Psychiatry | Admitting: Psychiatry

## 2015-01-31 DIAGNOSIS — F900 Attention-deficit hyperactivity disorder, predominantly inattentive type: Secondary | ICD-10-CM | POA: Insufficient documentation

## 2015-01-31 DIAGNOSIS — F331 Major depressive disorder, recurrent, moderate: Secondary | ICD-10-CM | POA: Insufficient documentation
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
## 2015-02-01 ENCOUNTER — Other Ambulatory Visit (HOSPITAL_COMMUNITY): Payer: 59 | Admitting: Psychiatry

## 2015-02-01 DIAGNOSIS — F331 Major depressive disorder, recurrent, moderate: Secondary | ICD-10-CM | POA: Diagnosis not present

## 2015-02-01 NOTE — Progress Notes (Signed)
    Daily Group Progress Note  Program: IOP  Group Time: 9:00-10:30  Participation Level: Active  Behavioral Response: Appropriate  Type of Therapy:  Group Therapy  Summary of Progress: Pt. Met with case manager for intake assessment.     Group Time: 10:30-12:00  Participation Level:  Active  Behavioral Response: Appropriate  Type of Therapy: Psycho-education Group  Summary of Progress: Pt. Participated in instruction about breathing exercises and use of grounding series to help manage anxiety and depression.  Nancie Neas, LPC

## 2015-02-01 NOTE — Progress Notes (Signed)
    Daily Group Progress Note  Program: IOP  Group Time: 9:00-10:30  Participation Level: Active  Behavioral Response: Appropriate  Type of Therapy:  Group Therapy  Summary of Progress: Pt. Presented with bright affect, talkative, engaged in the group process. Pt. Shared challenge of raising 36 year old son and concerns about his problems with depression.     Group Time: 10:30-12:00  Participation Level:  Active  Behavioral Response: Appropriate  Type of Therapy: Psycho-education Group  Summary of Progress: Pt. Participated in grief and loss group facilitated by [name].  [name], Michael Higgins, Michael Higgins

## 2015-02-05 ENCOUNTER — Other Ambulatory Visit (HOSPITAL_COMMUNITY): Payer: 59 | Admitting: *Deleted

## 2015-02-05 DIAGNOSIS — F331 Major depressive disorder, recurrent, moderate: Secondary | ICD-10-CM

## 2015-02-06 ENCOUNTER — Other Ambulatory Visit (HOSPITAL_COMMUNITY): Payer: 59 | Admitting: *Deleted

## 2015-02-06 DIAGNOSIS — F331 Major depressive disorder, recurrent, moderate: Secondary | ICD-10-CM

## 2015-02-07 ENCOUNTER — Other Ambulatory Visit (HOSPITAL_COMMUNITY): Payer: 59 | Admitting: Psychiatry

## 2015-02-07 DIAGNOSIS — F331 Major depressive disorder, recurrent, moderate: Secondary | ICD-10-CM | POA: Diagnosis not present

## 2015-02-07 NOTE — Progress Notes (Signed)
    Daily Group Progress Note  Program: IOP  Group Time: 0900-1045  Participation Level: Active  Behavioral Response: Appropriate and Sharing  Type of Therapy:  Group Therapy  Summary of Progress: Pt voiced that he feels very empty.  Pt continues to yearn for mother's acceptance; while grieving the loss of their relationship.  Pt mentioned that his son chose not to spend the holiday with him this year, since the disagreement couple of weeks ago.  Pt was very tearful in group today.     Group Time: 1100-1200  Participation Level:  Active  Behavioral Response: Appropriate and Sharing  Type of Therapy: Psycho-education Group  Summary of Progress: Warning Signals of Relapse:  Discussed the various signs and signals of relapse and steps to take towards recovery.  Then later guest speaker:  Chester HolsteinStephanie Rhodes, RN, CPSS [facility]) discussed what her organization provides for free within the community.  Mentioned the orientation process and was able to schedule an appointment.  Also, she went on to share her story after answering several questions.      Jeri Modenaita Constant [place], M.Ed, CNA

## 2015-02-08 ENCOUNTER — Other Ambulatory Visit (HOSPITAL_COMMUNITY): Payer: 59 | Admitting: Psychiatry

## 2015-02-08 DIAGNOSIS — F331 Major depressive disorder, recurrent, moderate: Secondary | ICD-10-CM | POA: Diagnosis not present
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
## 2015-02-12 ENCOUNTER — Other Ambulatory Visit (HOSPITAL_COMMUNITY): Payer: BLUE CROSS/BLUE SHIELD | Attending: Psychiatry | Admitting: Psychiatry

## 2015-02-12 DIAGNOSIS — F331 Major depressive disorder, recurrent, moderate: Secondary | ICD-10-CM | POA: Diagnosis not present

## 2015-02-12 NOTE — Progress Notes (Signed)
    Daily Group Progress Note  Program: IOP  Daily Group Progress Note Program: IOP  02/06/2015   Group Time: 9 AM - 10:30 AM Participation Level: Active  Behavioral Response: Appropriate, Sharing Type of Therapy:  Process Group Summary of Progress: Patients processed their individual experiences with home practice HALT Exercise. Patient shared that while he sees how exercise could be beneficial he lacks motivation which led to group processing of motivation to change discussion. Patient was attentive to others and acknowledged that 'this may be one of those things I simply must make myself do in order to get better.' Pt feels his motivation is currently high.   Group Time: 10:45 AM - 12 PM Participation Level:  Active Behavioral Response: Appropriate, Sharing Type of Therapy: Psycho-education Group Summary of Progress: Facilitator presented 'Wheel of Life' concept with short discussion of eight aspects intrinsic in most lives: Physical environment, Career, Family and Friends, Significant other, Fun and recreation, Health, Arts administratorMoney and personal growth. Patients then ranked their current satisfaction and future goals of satisfaction to see where they may wish to focus some energy. Patient reports he sees where his past focus has perhaps led to some of his exhaustion, fatigue and lack of self-care. Patient has high expectations for personal growth and that is where he chooses to focus his efforts. Pt offered support to others. BH-PIOPB PSYCH  Carney Bernatherine C Harrill, LCSW

## 2015-02-12 NOTE — Progress Notes (Signed)
    Daily Group Progress Note  Program: IOP  Daily Group Progress Note Program: IOP  02/05/2015   Group Time: 9 AM - 10:30 AM Participation Level: Active  Behavioral Response: Appropriate, Sharing, with some Rationalizing Type of Therapy:  Process Group Summary of Progress: Patients processed their holiday experiences during this portion of group and offered feedback to one another. Patient shared how his initial holiday encounter with son negatively impacted his weekend although others close to him over the holiday offered support. Patient ultimately was able to process unrealistic expectation of 35 YO male being able to understand aspects of mental illness and sadness related to changes in their relationship. Patient offered support to others and added elements of appropriate humor.    Group Time: 10:45 AM - 12 PM Participation Level:  Active Behavioral Response: Appropriate, Sharing Type of Therapy: Psycho-education Group Summary of Progress: Facilitator summarized emotions from individual holiday experiences and presented a home practice exercise which group members agreed to complete at least once before group the following day. Patient shared that while he can foresee how he'll likely answer the questions he has never thought of doing a self-check. Patient reports he has historically provided care to others verses self.  BH-PIOPB PSYCH Carney Bernatherine C Harrill, LCSW

## 2015-02-13 ENCOUNTER — Other Ambulatory Visit (HOSPITAL_COMMUNITY): Payer: BLUE CROSS/BLUE SHIELD | Admitting: Psychiatry

## 2015-02-13 ENCOUNTER — Telehealth (HOSPITAL_COMMUNITY): Payer: Self-pay | Admitting: Psychiatry
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
## 2015-02-14 ENCOUNTER — Other Ambulatory Visit (HOSPITAL_COMMUNITY): Payer: BLUE CROSS/BLUE SHIELD | Admitting: Psychiatry

## 2015-02-14 NOTE — Progress Notes (Signed)
Michael Higgins is a 35 yr old, single, employed, Caucasian male; who transitioned from the inpatient unit. He was [facility] 01-10-15 until 01-14-15 due to mood swings with SI. Pt denied SI upon admission. Pt also denied HI or A/V hallucinations. Pt stated he really can't pinpoint a trigger; except for poor communication. Reported having anger outbursts. Recently yelled at his 37 yr old son. Denied any prior inpt hospitalizations. Saw Michael Higgins for the first time a couple weeks ago for medication mgmt. Also, will be [facility]. Continues to have difficulty regulating his emotions; especially anger.  Pt continues to be working on inner child issues.  A:  D/C today.  F/U with Michael CelesteScott Higgins, LPC on 02-15-15 and Michael SeatsLisa Poulus, NP on 03-01-15.  Encouraged support groups.  R:  Pt receptive.   Chestine SporeLARK, RITA, M.Ed, CNA

## 2015-02-14 NOTE — Progress Notes (Signed)
Patient ID: Michael Higgins Mulhall, male   DOB: 04/08/1980, 35 y.o.   MRN: [id number] Discharge Note  Patient:  Michael Higgins Cue is an 35 y.o., male DOB:  08/16/1980  Date of Admission:  (Not on file)  Date of Discharge: 02/14/2015  Reason for Admission:depression  Hospital Course:attended and participated.  Says it was helpful to talk in group but unfortunately he felt worse as if he opened raw wounds without any healing,  Relationship with his partner worsened due to the increased irritability.  Mental Status at Discharge:still depressed and angry.  Not suicidal. Some beginning insight as to the basic root of his anger but not confident in how to deal with that anger effectively  Lab Results: No results found for this or any previous visit (from the past 48 hour(s)).   Current outpatient prescriptions:  .  Abacavir-Dolutegravir-Lamivud (TRIUMEQ) 600-50-300 MG TABS, Take 1 tablet by mouth daily. For HIV infection, Disp: 30 tablet, Rfl: 0 .  citalopram (CELEXA) 20 MG tablet, Take 1 tablet (20 mg total) by mouth daily. For depression, Disp: 30 tablet, Rfl: 0 .  divalproex (DEPAKOTE ER) 500 MG 24 hr tablet, Take 1 tablet (500 mg total) by mouth at bedtime. For mood stabilization (Patient taking differently: Take 500 mg by mouth at bedtime. Take 750 mg daily.  For mood stabilization), Disp: 30 tablet, Rfl: 0 .  fluticasone (FLONASE) 50 MCG/ACT nasal spray, Place 1 spray into both nostrils 2 (two) times daily. For allergies, Disp: , Rfl: 2 .  omeprazole (PRILOSEC) 20 MG capsule, Take 1 capsule (20 mg total) by mouth daily as needed (heartburn)., Disp: , Rfl:   Axis Diagnosis:  Major depression recurrent moderate   Level of Care:  IOP  Discharge destination:  Other:  has appointment with a psychiatrist and therapist  Is patient on multiple antipsychotic therapies at discharge:  No    Has Patient had three or more failed trials of antipsychotic monotherapy by history:  No  Patient phone:   [phone] (home)  Patient address:   [address] [place] [id number],   Follow-up recommendations:  Activity:  continue current activity Diet:  continue current diet  Comments:  none  The patient received suicide prevention pamphlet:  Yes Belongings returned:  na  Carolanne GrumblingGerald Montana Bryngelson 02/14/2015, 4:57 PM

## 2015-02-15 ENCOUNTER — Other Ambulatory Visit (HOSPITAL_COMMUNITY): Payer: BLUE CROSS/BLUE SHIELD

## 2015-02-18 ENCOUNTER — Other Ambulatory Visit (HOSPITAL_COMMUNITY): Payer: BLUE CROSS/BLUE SHIELD

## 2015-02-19 ENCOUNTER — Other Ambulatory Visit (HOSPITAL_COMMUNITY): Payer: BLUE CROSS/BLUE SHIELD

## 2015-02-20 ENCOUNTER — Other Ambulatory Visit (HOSPITAL_COMMUNITY): Payer: BLUE CROSS/BLUE SHIELD

## 2015-02-21 ENCOUNTER — Other Ambulatory Visit (HOSPITAL_COMMUNITY): Payer: BLUE CROSS/BLUE SHIELD

## 2015-02-22 ENCOUNTER — Other Ambulatory Visit (HOSPITAL_COMMUNITY): Payer: BLUE CROSS/BLUE SHIELD

## 2015-02-25 ENCOUNTER — Other Ambulatory Visit (HOSPITAL_COMMUNITY): Payer: BLUE CROSS/BLUE SHIELD

## 2015-02-26 ENCOUNTER — Other Ambulatory Visit (HOSPITAL_COMMUNITY): Payer: BLUE CROSS/BLUE SHIELD

## 2015-02-27 ENCOUNTER — Other Ambulatory Visit (HOSPITAL_COMMUNITY): Payer: BLUE CROSS/BLUE SHIELD

## 2015-02-28 ENCOUNTER — Other Ambulatory Visit (HOSPITAL_COMMUNITY): Payer: BLUE CROSS/BLUE SHIELD

## 2015-03-01 ENCOUNTER — Other Ambulatory Visit (HOSPITAL_COMMUNITY): Payer: BLUE CROSS/BLUE SHIELD

## 2015-03-04 ENCOUNTER — Other Ambulatory Visit (HOSPITAL_COMMUNITY): Payer: BLUE CROSS/BLUE SHIELD

## 2015-03-05 ENCOUNTER — Other Ambulatory Visit (HOSPITAL_COMMUNITY): Payer: BLUE CROSS/BLUE SHIELD

## 2015-03-06 ENCOUNTER — Other Ambulatory Visit (HOSPITAL_COMMUNITY): Payer: BLUE CROSS/BLUE SHIELD

## 2015-03-07 ENCOUNTER — Other Ambulatory Visit (HOSPITAL_COMMUNITY): Payer: BLUE CROSS/BLUE SHIELD

## 2015-03-08 ENCOUNTER — Other Ambulatory Visit (HOSPITAL_COMMUNITY): Payer: BLUE CROSS/BLUE SHIELD

## 2015-03-11 ENCOUNTER — Other Ambulatory Visit (HOSPITAL_COMMUNITY): Payer: BLUE CROSS/BLUE SHIELD

## 2015-03-12 ENCOUNTER — Other Ambulatory Visit (HOSPITAL_COMMUNITY): Payer: BLUE CROSS/BLUE SHIELD

## 2015-03-13 ENCOUNTER — Other Ambulatory Visit (HOSPITAL_COMMUNITY): Payer: 59

## 2015-03-14 ENCOUNTER — Other Ambulatory Visit (HOSPITAL_COMMUNITY): Payer: 59

## 2015-03-15 ENCOUNTER — Other Ambulatory Visit (HOSPITAL_COMMUNITY): Payer: 59

## 2015-03-18 ENCOUNTER — Other Ambulatory Visit (HOSPITAL_COMMUNITY): Payer: 59

## 2015-03-19 ENCOUNTER — Other Ambulatory Visit (HOSPITAL_COMMUNITY): Payer: 59

## 2015-03-20 ENCOUNTER — Other Ambulatory Visit (HOSPITAL_COMMUNITY): Payer: 59

## 2015-03-21 ENCOUNTER — Other Ambulatory Visit (HOSPITAL_COMMUNITY): Payer: 59

## 2017-09-23 IMAGING — CT CT HEAD W/O CM
2 series · 16 of 30 positions shown, 20 images · non-contrast
Comparison: None.

CLINICAL DATA: Blurry vision worsening over the last week.
Headache. No trauma.

EXAM:
CT HEAD WITHOUT CONTRAST
TECHNIQUE: Contiguous axial images were obtained from the base of the skull
through the vertex without intravenous contrast.

[Series 201: head w/o, idose (1) · axial · non-contrast · 0.40mm/px · z∈[+76,+211]mm · 13 of 33 slices shown, 17 images]
[im 3/33  brain]
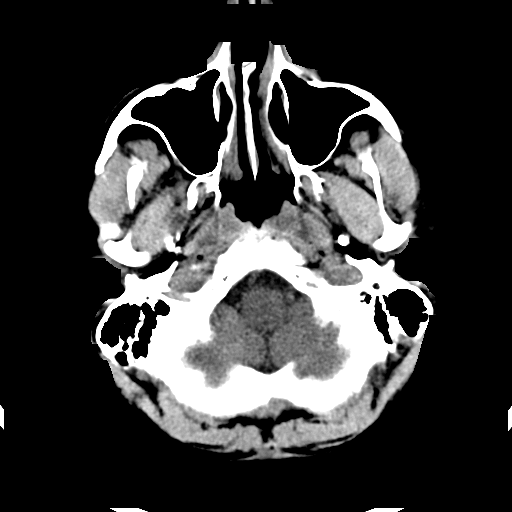
[im 3/33  bone]
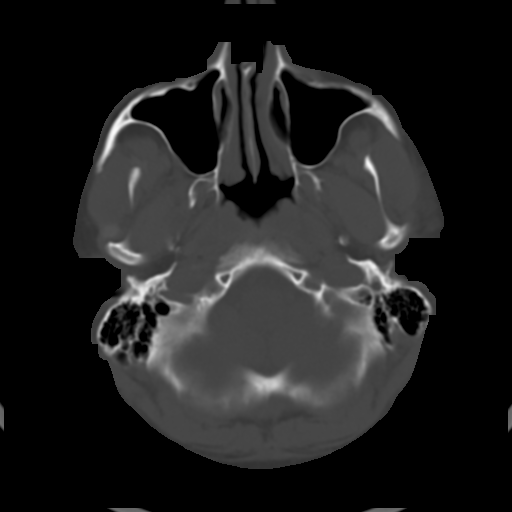
[im 5/33  brain]
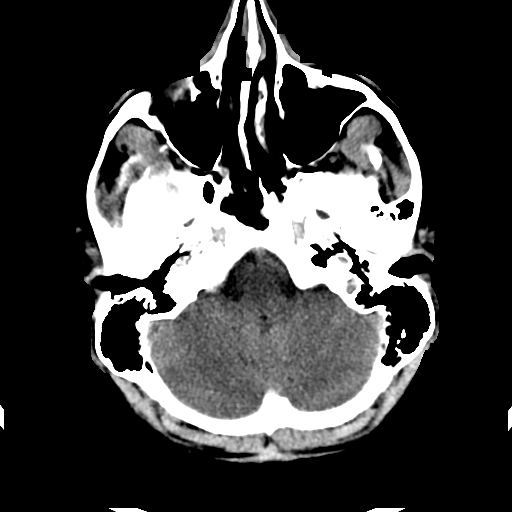
[im 7/33  brain]
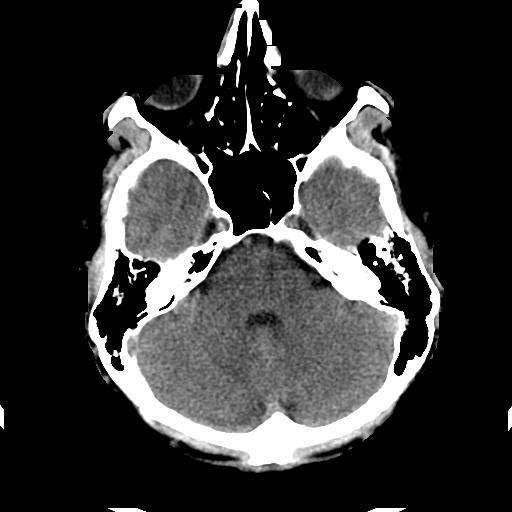
[im 10/33  brain]
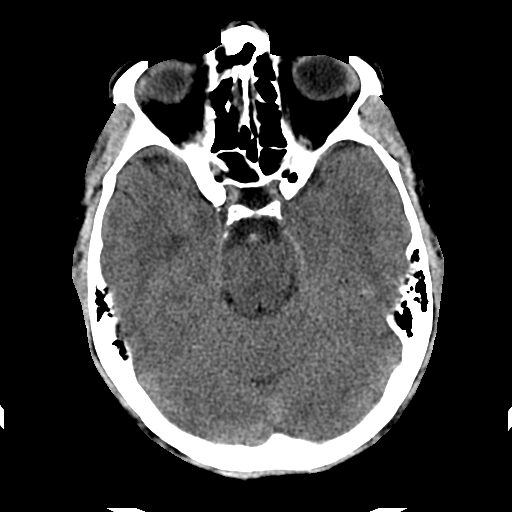
[im 12/33  brain]
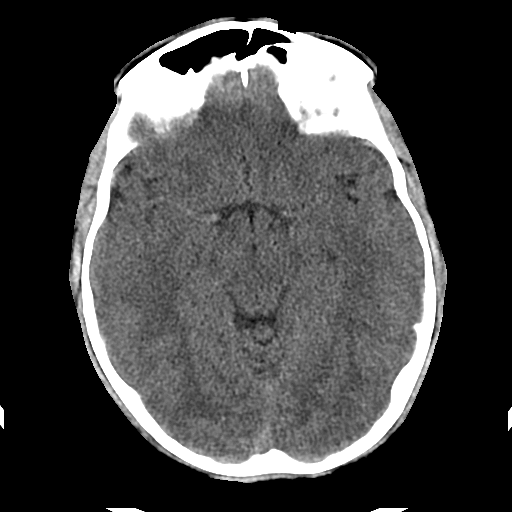
[im 12/33  bone]
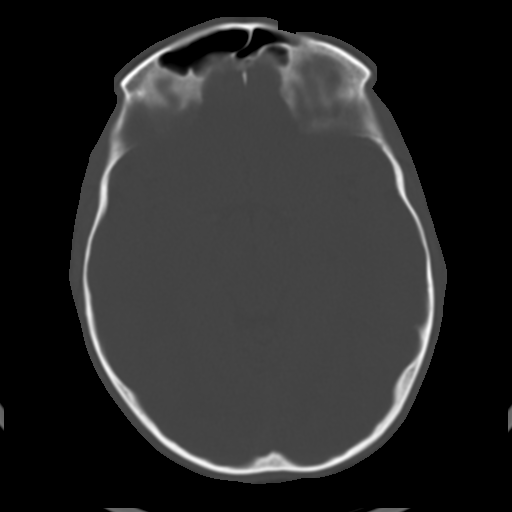
[im 14/33  brain]
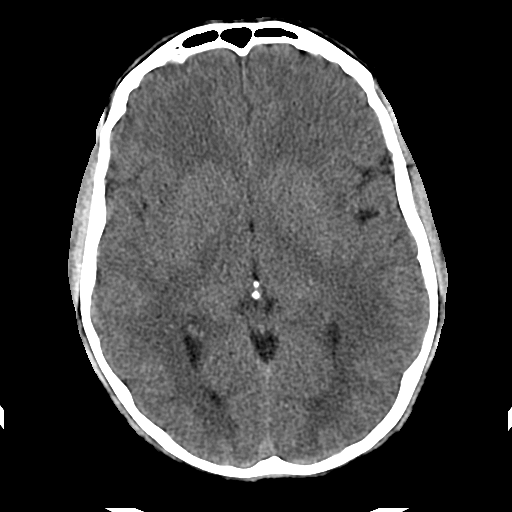
[im 17/33  brain]
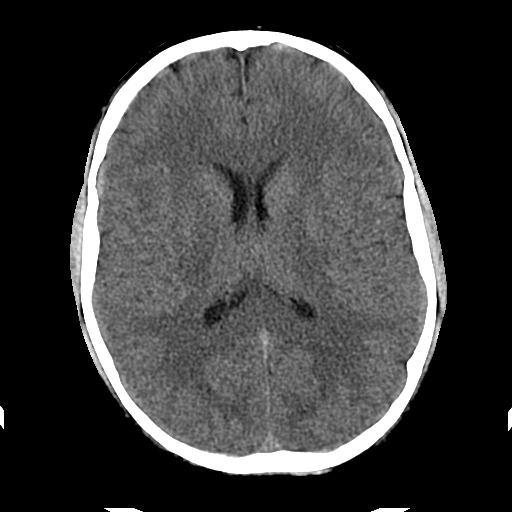
[im 19/33  brain]
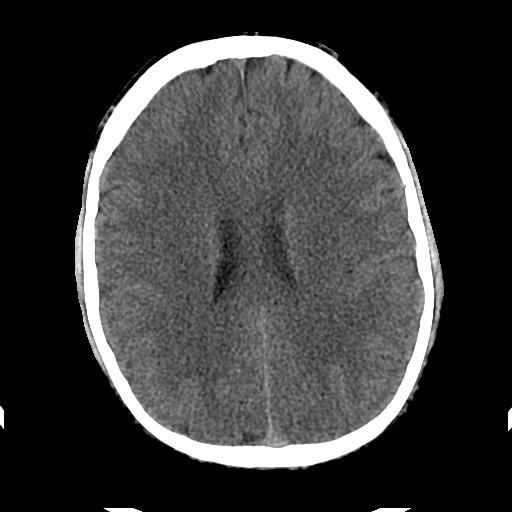
[im 21/33  brain]
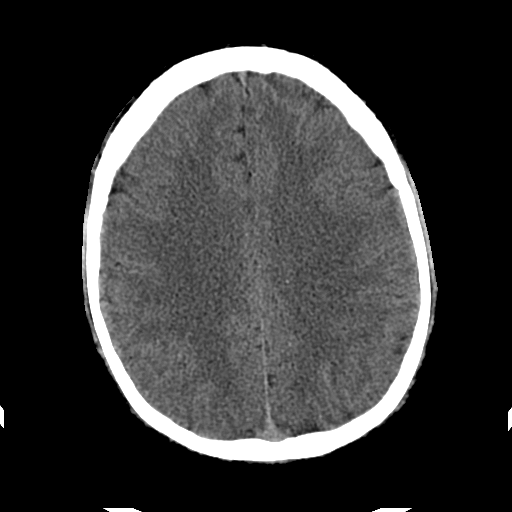
[im 21/33  bone]
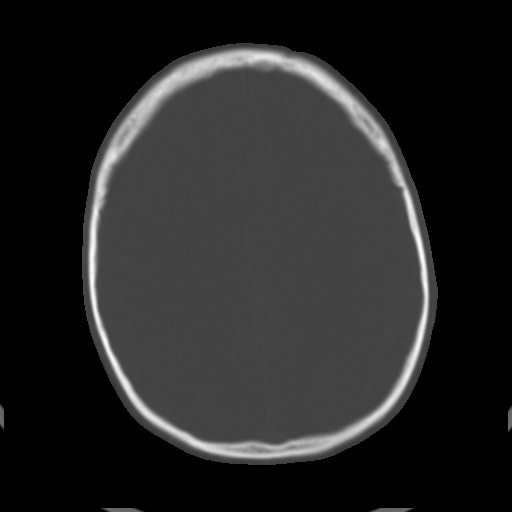
[im 23/33  brain]
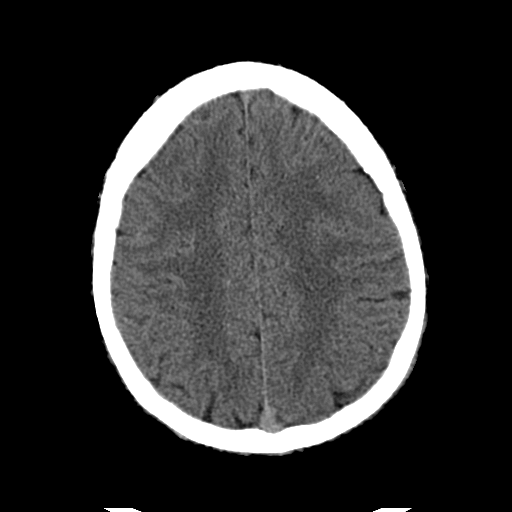
[im 26/33  brain]
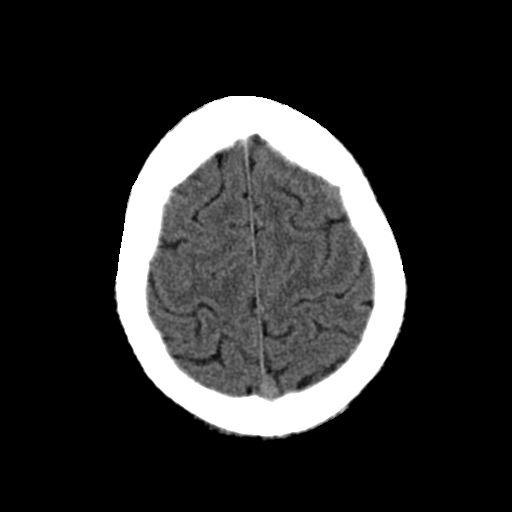
[im 28/33  brain]
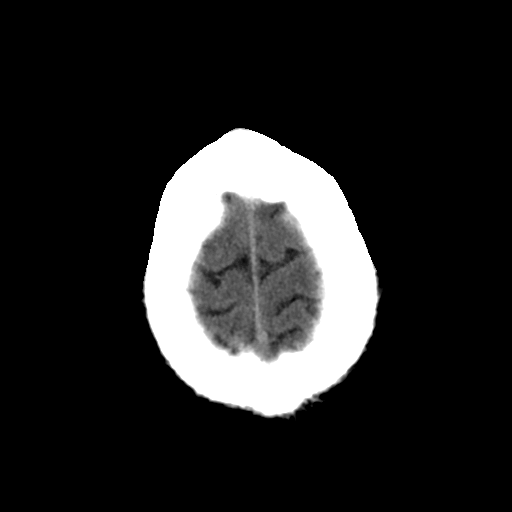
[im 30/33  brain]
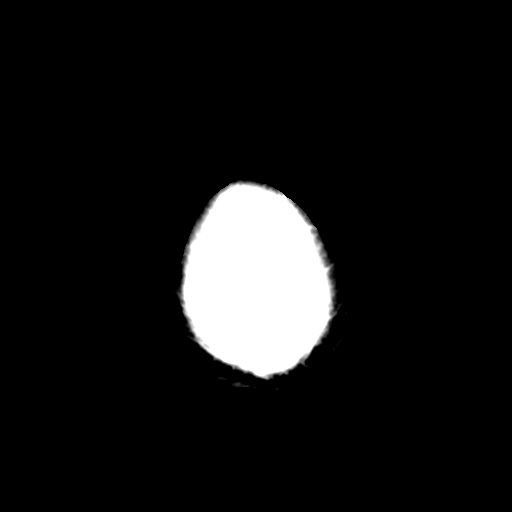
[im 30/33  bone]
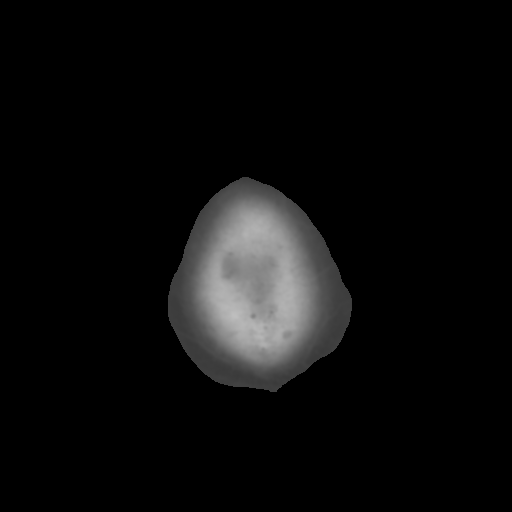

[Series 202: head w/o bone, idose (1) · axial · non-contrast · 0.40mm/px · z∈[+76,+121]mm · 3 of 33 slices shown]
[im 3/33  bone]
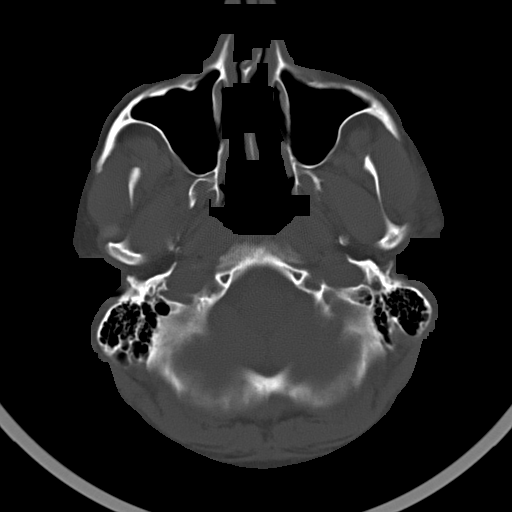
[im 7/33  bone]
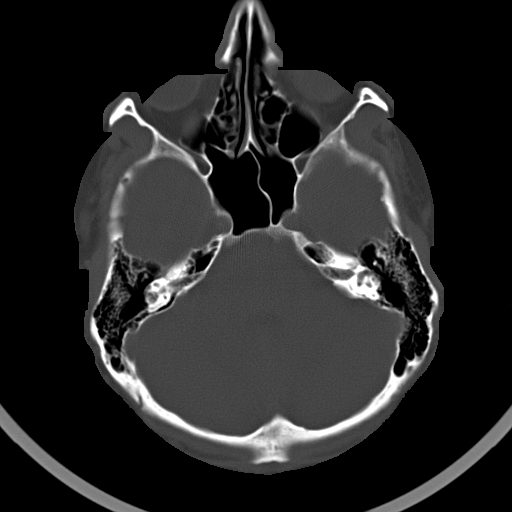
[im 12/33  bone]
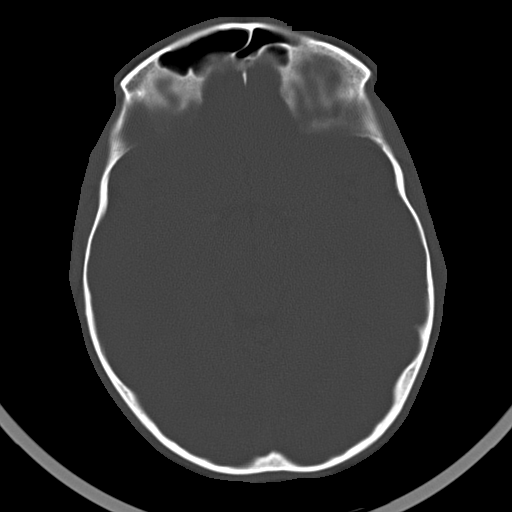

[16 of 30 positions shown; findings below may reference images not displayed]

FINDINGS: The ventricles are normal in size and configuration. There are no
parenchymal masses or mass effect. There is no evidence of an
infarct. There are no extra-axial masses or abnormal fluid
collections.

There is no intracranial hemorrhage.

Visualized sinuses and mastoid air cells are clear. No skull lesion.
IMPRESSION: Normal unenhanced CT scan of the brain.
